# Patient Record
Sex: Female | Born: 1960 | Race: White | Hispanic: No | Smoking: Former smoker
Health system: Southern US, Community
[De-identification: ages and names within clinical notes are randomized; demographics above are authoritative.]

## PROBLEM LIST (undated history)

## (undated) DIAGNOSIS — IMO0001 Reserved for inherently not codable concepts without codable children: Secondary | ICD-10-CM

## (undated) DIAGNOSIS — I10 Essential (primary) hypertension: Secondary | ICD-10-CM

## (undated) DIAGNOSIS — I779 Disorder of arteries and arterioles, unspecified: Secondary | ICD-10-CM

## (undated) DIAGNOSIS — M889 Osteitis deformans of unspecified bone: Secondary | ICD-10-CM

## (undated) DIAGNOSIS — M948X9 Other specified disorders of cartilage, unspecified sites: Secondary | ICD-10-CM

## (undated) DIAGNOSIS — Z720 Tobacco use: Secondary | ICD-10-CM

## (undated) DIAGNOSIS — E785 Hyperlipidemia, unspecified: Secondary | ICD-10-CM

## (undated) DIAGNOSIS — I219 Acute myocardial infarction, unspecified: Secondary | ICD-10-CM

## (undated) DIAGNOSIS — N390 Urinary tract infection, site not specified: Secondary | ICD-10-CM

## (undated) DIAGNOSIS — I739 Peripheral vascular disease, unspecified: Secondary | ICD-10-CM

## (undated) DIAGNOSIS — E1165 Type 2 diabetes mellitus with hyperglycemia: Principal | ICD-10-CM

## (undated) DIAGNOSIS — L409 Psoriasis, unspecified: Secondary | ICD-10-CM

## (undated) DIAGNOSIS — I2510 Atherosclerotic heart disease of native coronary artery without angina pectoris: Secondary | ICD-10-CM

## (undated) DIAGNOSIS — J40 Bronchitis, not specified as acute or chronic: Secondary | ICD-10-CM

## (undated) HISTORY — DX: Acute myocardial infarction, unspecified: I21.9

## (undated) HISTORY — DX: Atherosclerotic heart disease of native coronary artery without angina pectoris: I25.10

## (undated) HISTORY — DX: Other specified disorders of cartilage, unspecified sites: M94.8X9

## (undated) HISTORY — DX: Reserved for inherently not codable concepts without codable children: IMO0001

## (undated) HISTORY — DX: Type 2 diabetes mellitus with hyperglycemia: E11.65

## (undated) HISTORY — DX: Bronchitis, not specified as acute or chronic: J40

## (undated) HISTORY — DX: Peripheral vascular disease, unspecified: I73.9

## (undated) HISTORY — DX: Essential (primary) hypertension: I10

## (undated) HISTORY — DX: Urinary tract infection, site not specified: N39.0

## (undated) HISTORY — DX: Tobacco use: Z72.0

## (undated) HISTORY — DX: Disorder of arteries and arterioles, unspecified: I77.9

---

## 1999-05-04 ENCOUNTER — Other Ambulatory Visit: Admission: RE | Admit: 1999-05-04 | Discharge: 1999-05-04 | Payer: Self-pay | Admitting: Obstetrics and Gynecology

## 1999-06-01 HISTORY — PX: ABDOMINAL HYSTERECTOMY: SHX81

## 1999-07-29 ENCOUNTER — Encounter (INDEPENDENT_AMBULATORY_CARE_PROVIDER_SITE_OTHER): Payer: Self-pay

## 1999-07-30 ENCOUNTER — Inpatient Hospital Stay (HOSPITAL_COMMUNITY): Admission: AD | Admit: 1999-07-30 | Discharge: 1999-07-31 | Payer: Self-pay | Admitting: Obstetrics and Gynecology

## 2006-06-03 ENCOUNTER — Ambulatory Visit: Payer: Self-pay | Admitting: Internal Medicine

## 2006-06-03 LAB — CONVERTED CEMR LAB
ALT: 18 units/L (ref 0–40)
AST: 17 units/L (ref 0–37)
BUN: 10 mg/dL (ref 6–23)
Basophils Absolute: 0.1 10*3/uL (ref 0.0–0.1)
Basophils Relative: 0.8 % (ref 0.0–1.0)
CO2: 30 meq/L (ref 19–32)
Calcium: 8.8 mg/dL (ref 8.4–10.5)
Chol/HDL Ratio, serum: 6.9
Cholesterol: 189 mg/dL (ref 0–200)
Creatinine, Ser: 0.7 mg/dL (ref 0.4–1.2)
Eosinophil percent: 3 % (ref 0.0–5.0)
GFR calc non Af Amer: 96 mL/min
Hemoglobin, Urine: NEGATIVE
Hemoglobin: 15 g/dL (ref 12.0–15.0)
Ketones, ur: NEGATIVE mg/dL
LDL Cholesterol: 132 mg/dL — ABNORMAL HIGH (ref 0–99)
Monocytes Absolute: 1 10*3/uL — ABNORMAL HIGH (ref 0.2–0.7)
Neutro Abs: 5.6 10*3/uL (ref 1.4–7.7)
Nitrite: NEGATIVE
RBC: 5.04 M/uL (ref 3.87–5.11)
Sodium: 140 meq/L (ref 135–145)
Total Protein: 6.9 g/dL (ref 6.0–8.3)
VLDL: 29 mg/dL (ref 0–40)
WBC: 9.9 10*3/uL (ref 4.5–10.5)
pH: 5.5 (ref 5.0–8.0)

## 2006-06-07 ENCOUNTER — Ambulatory Visit: Payer: Self-pay | Admitting: Internal Medicine

## 2006-07-08 ENCOUNTER — Ambulatory Visit: Payer: Self-pay | Admitting: *Deleted

## 2006-07-18 ENCOUNTER — Encounter: Payer: Self-pay | Admitting: Internal Medicine

## 2006-07-18 ENCOUNTER — Ambulatory Visit (HOSPITAL_COMMUNITY): Admission: RE | Admit: 2006-07-18 | Discharge: 2006-07-18 | Payer: Self-pay | Admitting: Internal Medicine

## 2006-08-03 ENCOUNTER — Ambulatory Visit: Payer: Self-pay | Admitting: Internal Medicine

## 2006-08-04 ENCOUNTER — Ambulatory Visit: Payer: Self-pay | Admitting: Internal Medicine

## 2007-06-27 DIAGNOSIS — Z9189 Other specified personal risk factors, not elsewhere classified: Secondary | ICD-10-CM | POA: Insufficient documentation

## 2007-06-27 DIAGNOSIS — M25519 Pain in unspecified shoulder: Secondary | ICD-10-CM | POA: Insufficient documentation

## 2007-06-27 DIAGNOSIS — M25569 Pain in unspecified knee: Secondary | ICD-10-CM | POA: Insufficient documentation

## 2007-06-27 DIAGNOSIS — L408 Other psoriasis: Secondary | ICD-10-CM | POA: Insufficient documentation

## 2009-02-13 ENCOUNTER — Ambulatory Visit: Payer: Self-pay | Admitting: Internal Medicine

## 2009-02-13 DIAGNOSIS — E782 Mixed hyperlipidemia: Secondary | ICD-10-CM | POA: Insufficient documentation

## 2009-02-13 DIAGNOSIS — M948X9 Other specified disorders of cartilage, unspecified sites: Secondary | ICD-10-CM | POA: Insufficient documentation

## 2009-02-13 DIAGNOSIS — E119 Type 2 diabetes mellitus without complications: Secondary | ICD-10-CM | POA: Insufficient documentation

## 2009-02-18 ENCOUNTER — Encounter: Payer: Self-pay | Admitting: Internal Medicine

## 2009-04-14 ENCOUNTER — Encounter: Payer: Self-pay | Admitting: Internal Medicine

## 2010-06-21 ENCOUNTER — Encounter: Payer: Self-pay | Admitting: Internal Medicine

## 2010-06-28 LAB — CONVERTED CEMR LAB
BUN: 8 mg/dL (ref 6–23)
CO2: 31 meq/L (ref 19–32)
Calcium: 9.2 mg/dL (ref 8.4–10.5)
Creatinine, Ser: 0.7 mg/dL (ref 0.4–1.2)
GFR calc non Af Amer: 94.35 mL/min (ref >60)
Glucose, Bld: 269 mg/dL — ABNORMAL HIGH (ref 70–99)
Hgb A1c MFr Bld: 11.1 % — ABNORMAL HIGH (ref 4.6–6.5)
Potassium: 4 meq/L (ref 3.5–5.1)
Total CHOL/HDL Ratio: 8

## 2010-10-16 NOTE — Assessment & Plan Note (Signed)
[facility]                           PRIMARY CARE OFFICE NOTE   Monica Michael, Monica Michael                       MRN:          [id number]  DATE:06/07/2006                            DOB:          [birth year]    Monica Michael is a 50 year old Caucasian woman who presents to establish  ongoing continuity care.   CHIEF COMPLAINT:  1. Left knee pain, which has been intermittently bothering her for      some time but now is becoming somewhat worse.  It does not limit      her activities.  She has had no swelling, erythema or acute pain.  2. Right shoulder pain.  The patient has some right shoulder pain with      decreased range of motion.  The patient denies any previous injury.  3. Derm.  The patient with history of possible psoriasis which waxes      and wanes and is currently quiescent.   PAST MEDICAL HISTORY:   SURGICAL:  Hysterectomy in 2001 for uterine fibroids.   MEDICAL:  The patient had measles and mumps as a child.  She had chicken  pox as an adult.  She has pustular psoriasis.  GYN the patient is a gravida 1, para 1.   CURRENT MEDICATIONS:  No prescription drugs.   HABITS:  Tobacco at 1 pack per day with a 30 pack year smoking history.  Alcohol use is rare.  No recreational drug use.   FAMILY HISTORY:  Positive for arthritis in her grandparents, positive  for a history of hyperlipidemia throughout the family.  History of heart  disease through the family including an MI that are fatal for the men in  their 4s, women in their 50s.  Positive for stroke in her grandparents.  Positive for hypertension in all generations.  Positive for diabetes in  her father who also has end-stage renal disease and dialysis.  Positive  for diabetes in father, grandparents and other blood relatives.  Mother  had Hodgkin's disease and an uncle with leukemia.   SOCIAL HISTORY:  The patient has had 3 years of college.  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  She was  married for 14 years.  Divorced and single for almost 10 years, has been  remarried for 2 years.  The patient has an 63 year old son who currently  lives at home.  She reports her marriage is in good health.   REVIEW OF SYSTEMS:  The patient has had no constitutional problems.  She  has had an eye exam in the last 12 months.  No ENT, cardiovascular,  respiratory, GI complaints.  The patient has occasional stress  incontinence.  MSK as above.  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  No neurologic or psychiatric issues.   EXAMINATION:  Temperature was 98.  Blood pressure 97/68, pulse 68,  weight 169.  GENERAL:  Appearance is a well-nourished, well-developed woman in no  acute distress.  HEENT:  Normocephalic, atraumatic.  EAC's and TMs were unremarkable.  Oropharynx with native dentition in good repair.  No buccal or palate  lesions were noted.  Posterior pharynx was clear.  Conjunctivae was  clear.  PERRLA, EOMI, funduscopic exam with a hand-held instrument  revealed normal disc margins.  I did not appreciate any vascular  abnormalities.  NECK:  Was supple without thyromegaly or nodes.  No adenopathy was noted  in the cervical supraclavicular regions.  CHEST:  No CVA tenderness.  LUNGS:  Were clear to auscultation and percussion.  BREAST:  Exam deferred to Dr. Arelia Sneddon for GYN.  ABDOMEN:  Soft, no guarding or rebound.  No organosplenomegaly was  noted.  PELVIC & RECTAL:  Exams deferred to Dr. Arelia Sneddon.  EXTREMITIES:  No clubbing, cyanosis or edema.  The patient seems to have  no erythema or effusion about the left knee.  She does seem to have well  preserved range of motion.  The patient seems to have well preserved  range of motion of the right shoulder, although some discomfort in the  extremes.  NEURO:  The patient is awake, alert, oriented to person, place, time and  contacts.  Cranial nerves II through XII grossly  intact.  DTRs are 2+  and symmetrical in the biceps, radial and patellar tendon.  The patient  was neurologically otherwise intact.  DERM:  The patient has palmar erythema.  Did not appreciate any other  significant pustular lesions.  Perineal and vulva vaginal exam was not performed.   DATA BASE:  A 12-lead electrocardiogram revealed a normal sinus rhythm  without any acute or chronic injury.   Laboratory hemoglobin 15 grams, hematocrit was 44.7%, white count was 9,  900 with a normal differential.  Chemistries revealed normal  electrolytes.  Glucose was 142.  Kidney function normal with a  creatinine of 0.7.  Liver functions were normal.  Thyroid function  normal with a TSH of 1.43.  Urinalysis was negative.  Cholesterol is  189, triglycerides147, HDL was 27.5, LDL was 132.   ASSESSMENT AND PLAN:  1. Diabetes.  The patient with a fasting blood sugar of 142 indicates      she is most likely diabetic.  She does have a positive family      history for diabetes.  The patient has no prior diagnosis.  I have      no prior laboratories for comparison.  Plan:  Discussed with the      patient in detail.  I recommended lifestyle management with the      elimination of sugar from her diet and limit carbohydrates.  She is      provided with care notes printout on low carb counting diet for      diabetics.  2. Lipids.  Given the patient's hyperglycemia, probable diabetes, the      LDL goal for this patient would be 100 or less to reduce cardiac      risk.  Plan:  The patient is to follow a low fat diet.  I have      encouraged her to exercise on a regular basis for both diabetes and      for the management of hyperlipidemia.  3. The patient is to return in 3 months for repeat lipid panel and if      she is not at goal of 100 or less we need to consider medication.      The patient will also have a hemoglobin A1C to better quantitate      her diabetes. 4.  Derm.  The patient is currently stable  and follows with Dr. Venancio Poisson on a regular basis.  5. Ortho.  Formal examination was not done of her joints as the      patient does seem to be moving well.  I would like to see her back      for a consolidation visit in 3 to 4 months, at which time we can      reassess her joint discomfort.  I would have her take a      nonsteroidal anti-inflammatory drug at this time for relief of      discomfort.  If her symptoms become worse, would be happy to see      her back in the short term for reevaluation.  6. Health maintenance.  The patient is current and up to date with her      gynecologist for pelvic and Pap smear.  Her last exam was in 2001      and last breast exam was at that time as well.  The patient states      she will be scheduling an appointment with Dr. Arelia Sneddon for a      followup.   SUMMARY:  Presentation with the metabolic derangements as noted above.  Also with ongoing knee and shoulder pain which may need reassessment in  the near term if her symptoms continue to bother her.     Rosalyn Gess Norins, MD  Electronically Signed    MEN/MedQ  DD: 06/07/2006  DT: 06/08/2006  Job #: [id number]   cc:   Raynelle Highland, M.D.  Venancio Poisson, MD

## 2010-10-16 NOTE — Op Note (Signed)
[facility]  Patient:    Monica Michael, Monica Michael                      MRN: [id number] Proc. Date: 07/29/99 Adm. Date:  [id number] Attending:  Frederich Balding                           Operative Report  PREOPERATIVE DIAGNOSIS:  Uterine fibroids.  POSTOPERATIVE DIAGNOSIS:  Uterine fibroids.  Severe pelvic endometriosis.  OPERATION:  Diagnostic laparoscopy, subsequent exploratory laparotomy, total abdominal hysterectomy.  SURGEON:  Juluis Mire, M.D.  ASSISTANT:  Trevor Iha, M.D.  ANESTHESIA:  General endotracheal.  ESTIMATED BLOOD LOSS:  200 to 300 cc.  PACKS AND DRAINS:  None.  INTRAOPERATIVE BLOOD REPLACED:  None.  COMPLICATIONS:  None.  INDICATIONS:  Dictated in the history and physical.  DESCRIPTION OF PROCEDURE:  The patient was taken to the operating room and placed in the supine position.  After satisfactory level of general endotracheal anesthesia was obtained, the patient was placed in dorsal lithotomy position using the Allen stirrups.  The abdomen, perineum and vagina were prepped out with Betadine.  Examination under anesthesia revealed the uterus to be 10 to 12 weeks in size.  Adnexa unremarkable.  Speculum was placed in the vaginal vault.  The cervix was grasped with a single-toothed tenaculum.  A Hulka tenaculum was put in place. The single-toothed tenaculum and speculum were then removed. The bladder was emptied by in and out catheterization.  The patient was draped out for laparoscopy. A subumbilical incision made with a knife.  The Veress needle was introduced and the abdominal cavity.  The abdomen insufflated with approximately 2L carbon dioxide.  The operating laparoscope was introduced.  There was no evidence of injury to adjacent organs.  A 5 mm trocar was put in place under direct visualization.  The lateral gutters were clear including appendix.  The upper abdomen including liver and tip of the  gallbladder were unremarkable.  The uterus was enlarged with a large fundal fibroid.  The cul-de-sac was completely obstructed by an aggressive adhesive process between the colon and the back of the uterus.  The ovaries were involved in this process.  It was a thick adhesive process that could not easily be taken down through the laparoscope and precluded a vaginal approach to the surgery.  The decision was then to proceed with an abdominal hysterectomy.  The abdomen was deflated with carbon dioxide.  All trocars were removed. The subumbilical incision was closed with interrupted subcuticulars of 4-0 Vicryl.  The patient was repositioned in the supine position.  Next, a low transverse skin incision was made with a knife and carried through the subcutaneous tissues.  The fascia was entered sharply and incision in the fascia extended laterally.  The fascia was taken off the muscle superiorly and inferiorly.  The  rectus muscles were separated in the midline.  The peritoneum was entered sharply and incision in the peritoneum extended superiorly and inferiorly. OSullivan-OConnor retractor was put in place and bowel contents were packed superiorly out of the pelvic cavity.  We first turned to the posterior aspect of the uterus.  Both ovaries were dissected free from the adhesive process.  Next, we did free up some of the cul-de-sac area.  Next, both round ligaments were clamped, cut and suture ligated with 0 Vicryl.  Utero-ovarian pedicles were isolated, clamped, cut and doubly ligated first with  a free tie of 0 Vicryl, then a suture ligature of 0 Vicryl.  The bladder flap was then developed.  The uterine vessels skeletonized, clamped, cut and suture ligated with 0 Vicryl.  We then morselated the fundus from the cervical stump so we could better see the cul-de-sac area. The stump was grasped with a single-toothed tenaculum.  The colon was then carefully dissected from its  attachment to the posterior cervical stump.  At this point in time, we developed a space between the rectum and the vagina.  The uterosacral ligaments were thick but easily identifiable.  We used a clamp, cut and tie technique with suture ligatures of 0 Vicryl.  The parametrium was serially separated from the sides of the cervical stump.  Both uterosacral ligaments were clamped, cut and suture ligated with 0 Vicryl.  Vaginal angles were clamped and  cut.  The intervening vaginal mucosa was excised and the stump was passed off the operative field.  Vaginal angles secured with suture ligature of 0 Vicryl.  The  intervening vaginal mucosa closed with interrupted simple figure-of-eights of 0  Vicryl.  We thoroughly irrigated the pelvis.  Areas of oozing brought under control using the bipolar.  The ovaries were suspended to the round ligament.  Areas of  endometriosis on the ovaries were cauterized.  No endometriomas were noted. Atthe end of the procedure, we had excellent hemostasis and good suspension of the ovaries.  The OSullivan-OConnor retractor and packs were removed.  Muscles were  reapproximated with a running suture of 2-0 Vicryl.  Fascia closed with a running suture of 0 PDS.  Skin was closed with staples and Steri-Strips.  Sponge and instrument counts were reported correct by the circulating nurse x 2.  The patient tolerated the procedure well and was returned to the recovery room in good condition. DD:  07/30/99 TD:  07/30/99 Job: [id number]

## 2010-10-16 NOTE — Assessment & Plan Note (Signed)
[facility]                           PRIMARY CARE OFFICE NOTE   Monica Michael, Monica Michael                       MRN:          [id number]  DATE:08/03/2006                            DOB:          [birth year]    Monica Michael was seen as a new patient on 06/07/06, please see that  dictation. As part of a routine evaluation of a new patient, who is also  a smoker, a chest x-ray was obtained which revealed the patient to have  an asymmetric opacity in the left lung apex relative to the right. CT  scan was recommended and subsequently obtained.   CT performed 07/08/06 revealed tiny nodules within the left upper lobe  that are non-specific and could be a sequela of inflammation or  infection. Because of her smoking history, a follow up CT was  recommended in 3 months. There was abnormal sclerosis involving the mid-  thoracic spine noticed on the CT scan, read as suspicious for sclerotic  metastasis. 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 There was  an abnormal appearance at the manubrium and bilateral first ribs and  clavicular head that also favors fibrous dysplasia versus post traumatic  deformity.   Of note the patient has been well. She has had no weight loss, night  sweats, lymphadenopathy, weakness, or other evidence of any significant  underlying process, such as an occult malignancy.   The patient underwent a whole body bone scan on 07/18/06 read out by Dr.  Colonel Bald. It was felt that the appearance of these lesions at the  manubrium adjacent first ribs clavicular head and T9 vertebral body were  suggestive of fibrous dysplasia such as Monica Michael or possibly  post traumatic. It was not felt that these looked like metastatic  lesions. Dr. Sherrie Mustache in his discussion was concerned that there could be  an underlying malignant process and did raise the possibility of  pursuing this further with PET CT to look for any metabolically active  lesions.   The patient was in the office today to discuss these results. A long  discussion ensued including a review of her risks for cancer, as well as  a review of systems as to whether she has had any symptoms that could  suggest malignancy. 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 The patient was  provided with information from up-to-date for her review including a  description of Monica Michael, as well as the available treatments.  Before deciding upon any treatments, such as a bisphosphonate, she will  proceed with a DEXA scan. In addition, the patient is adamantly  encouraged to schedule a mammogram. This is part of the evaluation to  make sure that there is not an underlying malignancy present.   The patient will notify me of her mammogram results. She will be  scheduled for a DEXA scan and therapy will be recommended based on these  findings.   Both the patient and her significant  other were satisfied with this  discussion and understand fully the import of the study findings and  agreed to the plan as above.     Monica Gess Norins, MD  Electronically Signed    MEN/MedQ  DD: 08/04/2006  DT: 08/04/2006  Job #: [id number]   cc:   Gilford Silvius. Fischer, M.D.

## 2010-10-16 NOTE — Discharge Summary (Signed)
[facility]  Patient:    Monica Michael, Monica Michael                      MRN: [id number] Adm. Date:  [id number] Disc. Date: [id number] Attending:  Frederich Balding                           Discharge Summary  ADMISSION DIAGNOSIS:          Uterine fibroids.  DISCHARGE DIAGNOSES:          1. Uterine fibroids.                               2. Significant pelvic endometriosis.  PROCEDURE:                    Laparoscopy with subsequent total abdominal hysterectomy.  HISTORY OF PRESENT ILLNESS:   For the complete history and physical, please see the dictated note.  HOSPITAL COURSE:              The patient underwent laparoscopy with finding of  extensive pelvic adhesions and a large fundal fibroid.  She subsequently underwent a laparotomy through a low transverse incision and subsequent total abdominal hysterectomy.  Postoperatively, the patient did extremely well.  Pathology is still pending on the surgical specimen.  Her postoperative hemoglobin was 11, platelet count was 375,000.  She had an uncomplicated postoperative course and was discharged home on her second postoperative day.  At that time she was tolerating a regular diet and ambulating without difficulty.  Bowel sounds were active, although, she was not passing flatus.  She was voiding without difficulty.  She had no active bleeding.  A low transverse incision was intact and staples had been removed.  COMPLICATIONS:                None were encountered during her stay in the hospital. The patient was discharged home in stable condition.  DISPOSITION:                  Routine postoperative instructions and orders were given.  She is to avoid heavy lifting, vaginal entrance, or driving of a car. he is discharged home on Tylox as she needs for pain, and iron sulfate supplementation.  She is to call with a fever, increasing nausea and vomiting, abdominal pain, or active vaginal bleeding.   Reevaluation in the office in one week. DD:  07/31/99 TD:  08/01/99 Job: [id number]

## 2011-06-01 DIAGNOSIS — I2510 Atherosclerotic heart disease of native coronary artery without angina pectoris: Secondary | ICD-10-CM

## 2011-06-01 HISTORY — DX: Atherosclerotic heart disease of native coronary artery without angina pectoris: I25.10

## 2011-07-02 ENCOUNTER — Other Ambulatory Visit: Payer: Self-pay

## 2011-07-02 ENCOUNTER — Inpatient Hospital Stay (HOSPITAL_COMMUNITY)
Admission: EM | Admit: 2011-07-02 | Discharge: 2011-07-05 | DRG: 247 | Disposition: A | Discharge status: Home / Self Care | Payer: 59 | Attending: Cardiology | Admitting: Cardiology

## 2011-07-02 ENCOUNTER — Encounter (HOSPITAL_COMMUNITY): Payer: Self-pay | Admitting: Emergency Medicine

## 2011-07-02 ENCOUNTER — Emergency Department (HOSPITAL_COMMUNITY): Payer: 59

## 2011-07-02 DIAGNOSIS — I2510 Atherosclerotic heart disease of native coronary artery without angina pectoris: Secondary | ICD-10-CM | POA: Diagnosis present

## 2011-07-02 DIAGNOSIS — R739 Hyperglycemia, unspecified: Secondary | ICD-10-CM

## 2011-07-02 DIAGNOSIS — I214 Non-ST elevation (NSTEMI) myocardial infarction: Secondary | ICD-10-CM

## 2011-07-02 DIAGNOSIS — IMO0001 Reserved for inherently not codable concepts without codable children: Secondary | ICD-10-CM | POA: Diagnosis present

## 2011-07-02 DIAGNOSIS — E785 Hyperlipidemia, unspecified: Secondary | ICD-10-CM

## 2011-07-02 DIAGNOSIS — F17200 Nicotine dependence, unspecified, uncomplicated: Secondary | ICD-10-CM | POA: Diagnosis present

## 2011-07-02 DIAGNOSIS — R079 Chest pain, unspecified: Secondary | ICD-10-CM

## 2011-07-02 DIAGNOSIS — E119 Type 2 diabetes mellitus without complications: Secondary | ICD-10-CM

## 2011-07-02 DIAGNOSIS — Z720 Tobacco use: Secondary | ICD-10-CM

## 2011-07-02 DIAGNOSIS — M889 Osteitis deformans of unspecified bone: Secondary | ICD-10-CM | POA: Insufficient documentation

## 2011-07-02 HISTORY — DX: Hyperlipidemia, unspecified: E78.5

## 2011-07-02 HISTORY — DX: Psoriasis, unspecified: L40.9

## 2011-07-02 HISTORY — PX: CORONARY ANGIOPLASTY WITH STENT PLACEMENT: SHX49

## 2011-07-02 HISTORY — DX: Osteitis deformans of unspecified bone: M88.9

## 2011-07-02 LAB — DIFFERENTIAL
Eosinophils Absolute: 0.1 10*3/uL (ref 0.0–0.7)
Eosinophils Relative: 1 % (ref 0–5)
Lymphs Abs: 2.3 10*3/uL (ref 0.7–4.0)
Monocytes Absolute: 0.9 10*3/uL (ref 0.1–1.0)

## 2011-07-02 LAB — CBC
HCT: 46.5 % — ABNORMAL HIGH (ref 36.0–46.0)
MCH: 29.2 pg (ref 26.0–34.0)
MCV: 81.4 fL (ref 78.0–100.0)
Platelets: 242 10*3/uL (ref 150–400)
RBC: 5.71 MIL/uL — ABNORMAL HIGH (ref 3.87–5.11)
RDW: 13.7 % (ref 11.5–15.5)

## 2011-07-02 LAB — BASIC METABOLIC PANEL
Calcium: 10.4 mg/dL (ref 8.4–10.5)
Creatinine, Ser: 0.56 mg/dL (ref 0.50–1.10)
GFR calc non Af Amer: >90 mL/min (ref >90)
Glucose, Bld: 255 mg/dL — ABNORMAL HIGH (ref 70–99)
Sodium: 136 mEq/L (ref 135–145)

## 2011-07-02 LAB — CARDIAC PANEL(CRET KIN+CKTOT+MB+TROPI): CK, MB: 8.5 ng/mL (ref 0.3–4.0)

## 2011-07-02 MED ORDER — MORPHINE SULFATE 4 MG/ML IJ SOLN
4.0000 mg | Freq: Once | INTRAMUSCULAR | Status: AC
  Administered 2011-07-02: 4 mg via INTRAVENOUS
  Filled 2011-07-02: qty 1

## 2011-07-02 MED ORDER — ONDANSETRON HCL 4 MG/2ML IJ SOLN: 4.0000 mg | Freq: Four times a day (QID) | INTRAMUSCULAR | Status: DC | PRN

## 2011-07-02 MED ORDER — INSULIN ASPART 100 UNIT/ML ~~LOC~~ SOLN: 0.0000 [IU] | Freq: Three times a day (TID) | SUBCUTANEOUS | Status: DC

## 2011-07-02 MED ORDER — HEPARIN SODIUM (PORCINE) 5000 UNIT/ML IJ SOLN
INTRAMUSCULAR | Status: AC
  Filled 2011-07-02: qty 1

## 2011-07-02 MED ORDER — METOPROLOL TARTRATE 12.5 MG HALF TABLET
12.5000 mg | ORAL_TABLET | Freq: Two times a day (BID) | ORAL | Status: DC
  Administered 2011-07-03 (×3): 12.5 mg via ORAL
  Filled 2011-07-02 (×5): qty 1

## 2011-07-02 MED ORDER — ALPRAZOLAM 0.25 MG PO TABS: 0.2500 mg | ORAL_TABLET | Freq: Two times a day (BID) | ORAL | Status: DC | PRN

## 2011-07-02 MED ORDER — SODIUM CHLORIDE 0.9 % IV SOLN: 250.0000 mL | INTRAVENOUS | Status: DC | PRN

## 2011-07-02 MED ORDER — HEPARIN BOLUS VIA INFUSION
4000.0000 [IU] | Freq: Once | INTRAVENOUS | Status: AC
  Administered 2011-07-02: 4000 [IU] via INTRAVENOUS

## 2011-07-02 MED ORDER — SODIUM CHLORIDE 0.9 % IJ SOLN: 3.0000 mL | INTRAMUSCULAR | Status: DC | PRN

## 2011-07-02 MED ORDER — HEPARIN SOD (PORCINE) IN D5W 100 UNIT/ML IV SOLN
1300.0000 [IU]/h | INTRAVENOUS | Status: DC
  Administered 2011-07-02: 1300 [IU]/h via INTRAVENOUS
  Filled 2011-07-02 (×2): qty 250

## 2011-07-02 MED ORDER — ROSUVASTATIN CALCIUM 40 MG PO TABS
40.0000 mg | ORAL_TABLET | Freq: Every day | ORAL | Status: DC
  Administered 2011-07-03 – 2011-07-05 (×3): 40 mg via ORAL
  Filled 2011-07-02 (×3): qty 1

## 2011-07-02 MED ORDER — SODIUM CHLORIDE 0.9 % IJ SOLN
3.0000 mL | Freq: Two times a day (BID) | INTRAMUSCULAR | Status: DC
  Administered 2011-07-03 (×2): 3 mL via INTRAVENOUS

## 2011-07-02 MED ORDER — INSULIN ASPART 100 UNIT/ML ~~LOC~~ SOLN
0.0000 [IU] | Freq: Three times a day (TID) | SUBCUTANEOUS | Status: DC
  Administered 2011-07-03: 2 [IU] via SUBCUTANEOUS
  Administered 2011-07-03: 5 [IU] via SUBCUTANEOUS
  Administered 2011-07-04 (×2): 2 [IU] via SUBCUTANEOUS
  Administered 2011-07-05: 3 [IU] via SUBCUTANEOUS
  Filled 2011-07-02 (×2): qty 3

## 2011-07-02 MED ORDER — ASPIRIN EC 81 MG PO TBEC
81.0000 mg | DELAYED_RELEASE_TABLET | Freq: Every day | ORAL | Status: DC
  Administered 2011-07-03 – 2011-07-05 (×3): 81 mg via ORAL
  Filled 2011-07-02 (×3): qty 1

## 2011-07-02 MED ORDER — NITROGLYCERIN 0.4 MG SL SUBL
0.4000 mg | SUBLINGUAL_TABLET | SUBLINGUAL | Status: DC | PRN
  Administered 2011-07-02: 0.4 mg via SUBLINGUAL

## 2011-07-02 MED ORDER — ACETAMINOPHEN 325 MG PO TABS: 650.0000 mg | ORAL_TABLET | ORAL | Status: DC | PRN

## 2011-07-02 MED ORDER — FAMOTIDINE 20 MG PO TABS
20.0000 mg | ORAL_TABLET | Freq: Once | ORAL | Status: AC
  Administered 2011-07-02: 20 mg via ORAL
  Filled 2011-07-02: qty 1

## 2011-07-02 MED ORDER — ASPIRIN 81 MG PO CHEW: 324.0000 mg | CHEWABLE_TABLET | ORAL | Status: DC

## 2011-07-02 MED ORDER — HEPARIN BOLUS VIA INFUSION: 5000.0000 [IU] | Freq: Once | INTRAVENOUS | Status: DC

## 2011-07-02 MED ORDER — ASPIRIN 81 MG PO CHEW
324.0000 mg | CHEWABLE_TABLET | Freq: Once | ORAL | Status: AC
  Administered 2011-07-02: 324 mg via ORAL
  Filled 2011-07-02: qty 4

## 2011-07-02 MED ORDER — NITROGLYCERIN 0.4 MG SL SUBL
0.4000 mg | SUBLINGUAL_TABLET | Freq: Once | SUBLINGUAL | Status: AC
  Administered 2011-07-02: 0.4 mg via SUBLINGUAL
  Filled 2011-07-02: qty 75

## 2011-07-02 NOTE — H&P (Addendum)
Patient ID: Monica Michael MRN: [id number] DOB/AGE: [birth year] 51 y.o. Admit date: 07/02/2011  Primary Care Physician:Michael Norins, MD, MD Primary Cardiologist  Narda Rutherford  HPI:   I was called to come to the emergency room to see the patient immediately with ongoing chest pain. The patient is followed by Dr. Debby Bud . She has not seen him for at least 2 years. Also he had written prescriptions for her for her metformin and simvastatin. She's not taking them. Over the past day the patient has had chest pain that has been intermittent. She had several episodes during the night. She continued to have waves of this chest discomfort and eventually came to the emergency room. In the emergency room she received morphine and nitroglycerin and she felt better. Her troponin is positive. At this time she is pain-free.  Review of systems complete and found to be negative unless listed above  Past Medical History  Diagnosis Date  . Diabetes mellitus     No family history on file.  History   Social History  . Marital Status: Widowed    Spouse Name: N/A    Number of Children: N/A  . Years of Education: N/A   Occupational History  . Not on file.   Social History Main Topics  . Smoking status: Current Everyday Smoker  . Smokeless tobacco: Not on file  . Alcohol Use: No  . Drug Use: No  . Sexually Active:    Other Topics Concern  . Not on file   Social History Narrative  . No narrative on file    Past Surgical History  Procedure Date  . Abdominal hysterectomy        Physical Exam: Blood pressure 113/97, pulse 85, temperature 98 F (36.7 C), temperature source Oral, resp. rate 15, height 5\' 3"  (1.6 m), weight 190 lb (86.183 kg), SpO2 98.00%.  Patient is quite stable at this time. She has no chest pain. Head is atraumatic. There is no jugulovenous distention. Lungs are clear. Respiratory effort is nonlabored. Cardiac exam reveals S1 and S2. There are no clicks or significant murmurs. The  abdomen is soft. There is no peripheral edema. She does have some skin lesions of psoriasis. There no musculoskeletal deformities.   Labs:   Lab Results  Component Value Date   WBC 8.1 07/02/2011   HGB 16.7* 07/02/2011   HCT 46.5* 07/02/2011   MCV 81.4 07/02/2011   PLT 242 07/02/2011    Lab 07/02/11 1724  NA 136  K 3.6  CL 97  CO2 28  BUN 10  CREATININE 0.56  CALCIUM 10.4  PROT --  BILITOT --  ALKPHOS --  ALT --  AST --  GLUCOSE 255*   Lab Results  Component Value Date   CKTOTAL 117 07/02/2011   CKMB 8.5* 07/02/2011   TROPONINI 1.44* 07/02/2011    Lab Results  Component Value Date   CHOL 189 02/13/2009   CHOL 189 06/03/2006   Lab Results  Component Value Date   HDL 22.40* 02/13/2009   HDL 27.5* 06/03/2006   Lab Results  Component Value Date   LDLCALC 129* 02/13/2009   LDLCALC 132* 06/03/2006   Lab Results  Component Value Date   TRIG 187.0* 02/13/2009   Lab Results  Component Value Date   CHOLHDL 8 02/13/2009       Radiology: Dg Chest 2 View  07/02/2011  *RADIOLOGY REPORT*  Clinical Data: Chest pain.  CHEST - 2 VIEW  Comparison: Plain films  of the chest 06/07/2006 and CT chest 07/08/2006.  Findings: Chronic peribronchial thickening is unchanged.  There is no focal airspace disease or effusion.  Heart size is normal.  No focal bony abnormality.  IMPRESSION: No acute finding.  Stable compared to prior exam.  Original Report Authenticated By: Bernadene Bell. D'ALESSIO, M.D.   EKG:  EKG is done emergency room. I personally reviewed it. There is normal sinus rhythm. There are mild nonspecific ST-T wave changes with some ST depression.  ASSESSMENT AND PLAN:    Principal Problem:   *Non-STEMI (non-ST elevated myocardial infarction) The patient has symptoms and labs consistent with non-STEMI. She has received aspirin. She's getting heparin at this time. She'll be treated with a beta blocker if her heart rate and blood pressure are stable. Statin will be added. If she has any significant  return of chest pain urgent cardiac catheterization will be done.   DIAB W/O MENTION COMP TYPE II/UNS TYPE UNCNTRL  Unfortunately the patient did not follow her physician's orders and start her medicines for her diabetes. These will be started in the hospital.   Hyperlipidemia Patient failed to start her medications. Statin will be started immediately.   Signed: Willa Rough 07/02/2011, 7:07 PM Co-Sign MD

## 2011-07-02 NOTE — Progress Notes (Signed)
ANTICOAGULATION CONSULT NOTE - Initial Consult  Pharmacy Consult for Heparin Indication: chest pain/ACS  No Known Allergies  Patient Measurements: Height: 5\' 3"  (160 cm) Weight: 190 lb (86.183 kg) IBW/kg (Calculated) : 52.4  Heparin Dosing Weight: 86 kg  Vital Signs: Temp: 98 F (36.7 C) (02/01 1754) Temp src: Oral (02/01 1754) BP: 113/97 mmHg (02/01 1815) Pulse Rate: 85  (02/01 1815)  Labs:  Basename 07/02/11 1724  HGB 16.7*  HCT 46.5*  PLT 242  APTT --  LABPROT --  INR --  HEPARINUNFRC --  CREATININE 0.56  CKTOTAL 117  CKMB 8.5*  TROPONINI 1.44*   Estimated Creatinine Clearance: 86.6 ml/min (by C-G formula based on Cr of 0.56).  Medical History: Past Medical History  Diagnosis Date  . Diabetes mellitus     Medications:   (Not in a hospital admission)  Assessment: 51 yo F admitted with chest pain to start heparin.  Patient denies recent bleeding, surgeries and bleeding disorder.    Goal of Therapy:  Heparin level 0.3-0.7 units/ml   Plan:  1. Heparin 4000 units x 1 then 2. Heparin 1300 units/hr 3. Heparin level 6h s/p ggt starts 4. Daily heparin level and CBC  Lovenia Kim Pharm.D., BCPS Clinical Pharmacist 07/02/2011 7:12 PM Pager: [phone] Phone: [phone]

## 2011-07-02 NOTE — ED Provider Notes (Signed)
History     CSN: [id number]  Arrival date & time 07/02/11  1537   First MD Initiated Contact with Patient 07/02/11 1643      Chief Complaint  Patient presents with  . Chest Pain    (Consider location/radiation/quality/duration/timing/severity/associated sxs/prior treatment) HPI  50yoF h/o non-insulin-dependent diabetes (noncompliant with metformin) presents with chest pain. She states that she first noticed chest pain approximately one week ago. She states at that time she had it at rest for several minutes and subsided spontaneously. She states that last night she had a meal of shrimp and grits, collard greens and shortly thereafter she began having a burning sensation in her chest and her throat. She states that the pain escalated and she felt burning to her bilateral upper extremities. Numbness, tingling, weakness to the same. There is no radiation to her back. No h/o same. No h/o GERD per patient. Also c/o R lower back numbness which has been present x number of months. Denies back pain. No rash.    ED Notes, ED Provider Notes from 07/02/11 0000 to 07/02/11 16:25:46       Rulon Eisenmenger, RN 07/02/2011 16:22      Pt st's she had mid chest pain 1 week ago then subsided, had the pain again last pm with some nausea and diaphoresis. Pt denies any pain at this time. St's had pain earlier today. Pt also c/o numbness in lower back.    Past Medical History  Diagnosis Date  . Diabetes mellitus   . Hyperlipidemia   . Psoriasis   . Paget's disease of bone     Noted on chest CT scan    Past Surgical History  Procedure Date  . Abdominal hysterectomy     No family history on file.  History  Substance Use Topics  . Smoking status: Current Everyday Smoker  . Smokeless tobacco: Not on file  . Alcohol Use: No    OB History    Grav Para Term Preterm Abortions TAB SAB Ect Mult Living                  Review of Systems  All other systems reviewed and are negative.  except as noted  HPI   Allergies  Review of patient's allergies indicates no known allergies.  Home Medications   Current Outpatient Rx  Name Route Sig Dispense Refill  . NAPROXEN SODIUM 220 MG PO TABS Oral Take 220 mg by mouth 2 (two) times daily with a meal.      BP 113/97  Pulse 85  Temp(Src) 98 F (36.7 C) (Oral)  Resp 15  Ht 5\' 3"  (1.6 m)  Wt 190 lb (86.183 kg)  BMI 33.66 kg/m2  SpO2 98%  Physical Exam  Nursing note and vitals reviewed. Constitutional: She is oriented to person, place, and time. She appears well-developed.  HENT:  Head: Atraumatic.  Mouth/Throat: Oropharynx is clear and moist.  Eyes: Conjunctivae and EOM are normal. Pupils are equal, round, and reactive to light.  Neck: Normal range of motion. Neck supple.  Cardiovascular: Normal rate, regular rhythm, normal heart sounds and intact distal pulses.   Pulmonary/Chest: Effort normal and breath sounds normal. No respiratory distress. She has no wheezes. She has no rales.  Abdominal: Soft. She exhibits no distension. There is no tenderness. There is no rebound and no guarding.  Musculoskeletal: Normal range of motion.  Neurological: She is alert and oriented to person, place, and time.  Skin: Skin is warm and dry.  No rash noted.  Psychiatric: She has a normal mood and affect.     Date: 07/02/2011  Rate: 81  Rhythm: normal sinus rhythm  QRS Axis: normal  Intervals: normal  ST/T Wave abnormalities: ST depressions laterally  Conduction Disutrbances:none  Narrative Interpretation:   Old EKG Reviewed: none given for comparison   ED Course  Procedures (including critical care time)  Labs Reviewed  CBC - Abnormal; Notable for the following:    RBC 5.71 (*)    Hemoglobin 16.7 (*)    HCT 46.5 (*)    All other components within normal limits  BASIC METABOLIC PANEL - Abnormal; Notable for the following:    Glucose, Bld 255 (*)    All other components within normal limits  CARDIAC PANEL(CRET KIN+CKTOT+MB+TROPI) -  Abnormal; Notable for the following:    CK, MB 8.5 (*)    Troponin I 1.44 (*)    Relative Index 7.3 (*)    All other components within normal limits  DIFFERENTIAL   Dg Chest 2 View  07/02/2011  *RADIOLOGY REPORT*  Clinical Data: Chest pain.  CHEST - 2 VIEW  Comparison: Plain films of the chest 06/07/2006 and CT chest 07/08/2006.  Findings: Chronic peribronchial thickening is unchanged.  There is no focal airspace disease or effusion.  Heart size is normal.  No focal bony abnormality.  IMPRESSION: No acute finding.  Stable compared to prior exam.  Original Report Authenticated By: Bernadene Bell. D'ALESSIO, M.D.     1. NSTEMI (non-ST elevated myocardial infarction)   2. Hyperglycemia      MDM  The patient pw chest pain. H/o NIDDM, concompliant, HLD. EKG abnormal, as above. ASA, morphine, nitroglycerin, labs, CXR. Repeat EKG. Reassess.  [id number] Patient states pain 0.5/10 at this time. Feels much better. Labs reviewed. C/W NSTEMI. Heparin ordered. Will order additional NTG sl at this time for her mild discomfort. Cardiology paged for admission.  [address] cardiology to see.  7:13 PM  Patient denies CP at this time.  Forbes Cellar, MD 07/02/11 [phone]

## 2011-07-02 NOTE — ED Notes (Signed)
Report given to Inetta Fermo, RN on yellow.  Patient to be moved to yellow, 31.

## 2011-07-02 NOTE — ED Notes (Signed)
Pt st's she had mid chest pain 1 week ago then subsided, had the pain again last pm with some nausea and diaphoresis.  Pt denies any pain at this time.  St's had pain earlier today.  Pt also c/o numbness in lower back.

## 2011-07-03 ENCOUNTER — Other Ambulatory Visit: Payer: Self-pay

## 2011-07-03 ENCOUNTER — Encounter (HOSPITAL_COMMUNITY)
Admission: EM | Disposition: A | Discharge status: Home / Self Care | Payer: Self-pay | Source: Home / Self Care | Attending: Cardiology

## 2011-07-03 HISTORY — PX: PERCUTANEOUS CORONARY STENT INTERVENTION (PCI-S): SHX5485

## 2011-07-03 HISTORY — PX: LEFT HEART CATHETERIZATION WITH CORONARY ANGIOGRAM: SHX5451

## 2011-07-03 LAB — BASIC METABOLIC PANEL
BUN: 11 mg/dL (ref 6–23)
Chloride: 98 mEq/L (ref 96–112)
GFR calc Af Amer: >90 mL/min (ref >90)
Potassium: 3.4 mEq/L — ABNORMAL LOW (ref 3.5–5.1)
Sodium: 133 mEq/L — ABNORMAL LOW (ref 135–145)

## 2011-07-03 LAB — CARDIAC PANEL(CRET KIN+CKTOT+MB+TROPI)
Relative Index: 7 — ABNORMAL HIGH (ref 0.0–2.5)
Relative Index: INVALID (ref 0.0–2.5)
Relative Index: INVALID (ref 0.0–2.5)
Total CK: 108 U/L (ref 7–177)
Total CK: 94 U/L (ref 7–177)
Troponin I: 4.4 ng/mL (ref <0.30)

## 2011-07-03 LAB — GLUCOSE, CAPILLARY
Glucose-Capillary: 193 mg/dL — ABNORMAL HIGH (ref 70–99)
Glucose-Capillary: 132 mg/dL — ABNORMAL HIGH (ref 70–99)

## 2011-07-03 LAB — HEPATIC FUNCTION PANEL
ALT: 19 U/L (ref 0–35)
AST: 22 U/L (ref 0–37)
Albumin: 3.2 g/dL — ABNORMAL LOW (ref 3.5–5.2)
Bilirubin, Direct: <0.1 mg/dL (ref 0.0–0.3)
Total Protein: 6.5 g/dL (ref 6.0–8.3)

## 2011-07-03 LAB — LIPID PANEL
Cholesterol: 203 mg/dL — ABNORMAL HIGH (ref 0–200)
Triglycerides: 214 mg/dL — ABNORMAL HIGH (ref <150)

## 2011-07-03 LAB — CBC
HCT: 42.2 % (ref 36.0–46.0)
Hemoglobin: 15 g/dL (ref 12.0–15.0)
MCV: 82.1 fL (ref 78.0–100.0)
RBC: 5.14 MIL/uL — ABNORMAL HIGH (ref 3.87–5.11)
RDW: 13.8 % (ref 11.5–15.5)
WBC: 9 10*3/uL (ref 4.0–10.5)

## 2011-07-03 LAB — HEMOGLOBIN A1C: Mean Plasma Glucose: 286 mg/dL — ABNORMAL HIGH (ref <117)

## 2011-07-03 LAB — HEPARIN LEVEL (UNFRACTIONATED): Heparin Unfractionated: 0.34 IU/mL (ref 0.30–0.70)

## 2011-07-03 SURGERY — PERCUTANEOUS CORONARY STENT INTERVENTION (PCI-S)
Anesthesia: LOCAL | Laterality: Right

## 2011-07-03 MED ORDER — TICAGRELOR 90 MG PO TABS
90.0000 mg | ORAL_TABLET | Freq: Two times a day (BID) | ORAL | Status: DC
  Administered 2011-07-03 – 2011-07-05 (×4): 90 mg via ORAL
  Filled 2011-07-03 (×6): qty 1

## 2011-07-03 MED ORDER — SODIUM CHLORIDE 0.9 % IV SOLN
1.0000 mL/kg/h | INTRAVENOUS | Status: AC
  Administered 2011-07-03 (×2): 1 mL/kg/h via INTRAVENOUS

## 2011-07-03 MED ORDER — VERAPAMIL HCL 2.5 MG/ML IV SOLN
INTRAVENOUS | Status: AC
  Filled 2011-07-03: qty 2

## 2011-07-03 MED ORDER — HEPARIN SODIUM (PORCINE) 1000 UNIT/ML IJ SOLN
INTRAMUSCULAR | Status: AC
  Filled 2011-07-03: qty 1

## 2011-07-03 MED ORDER — ACETAMINOPHEN 325 MG PO TABS: 650.0000 mg | ORAL_TABLET | ORAL | Status: DC | PRN

## 2011-07-03 MED ORDER — TICAGRELOR 90 MG PO TABS
ORAL_TABLET | ORAL | Status: AC
  Administered 2011-07-04: 90 mg via ORAL
  Filled 2011-07-03: qty 2

## 2011-07-03 MED ORDER — DIAZEPAM 5 MG PO TABS
10.0000 mg | ORAL_TABLET | ORAL | Status: AC
  Administered 2011-07-03: 10 mg via ORAL
  Filled 2011-07-03: qty 2

## 2011-07-03 MED ORDER — OXYCODONE-ACETAMINOPHEN 5-325 MG PO TABS: 1.0000 | ORAL_TABLET | ORAL | Status: DC | PRN

## 2011-07-03 MED ORDER — SODIUM CHLORIDE 0.9 % IV SOLN: INTRAVENOUS | Status: DC

## 2011-07-03 MED ORDER — MIDAZOLAM HCL 2 MG/2ML IJ SOLN
INTRAMUSCULAR | Status: AC
  Filled 2011-07-03: qty 2

## 2011-07-03 MED ORDER — HEPARIN (PORCINE) IN NACL 2-0.9 UNIT/ML-% IJ SOLN
INTRAMUSCULAR | Status: AC
  Filled 2011-07-03: qty 2000

## 2011-07-03 MED ORDER — ASPIRIN 81 MG PO CHEW: 81.0000 mg | CHEWABLE_TABLET | Freq: Every day | ORAL | Status: DC

## 2011-07-03 MED ORDER — ONDANSETRON HCL 4 MG/2ML IJ SOLN: 4.0000 mg | Freq: Four times a day (QID) | INTRAMUSCULAR | Status: DC | PRN

## 2011-07-03 MED ORDER — SODIUM CHLORIDE 0.9 % IV SOLN
0.2500 mg/kg/h | INTRAVENOUS | Status: DC
  Administered 2011-07-03: 0.25 mg/kg/h via INTRAVENOUS
  Filled 2011-07-03: qty 250

## 2011-07-03 MED ORDER — ASPIRIN 81 MG PO CHEW
CHEWABLE_TABLET | ORAL | Status: AC
  Filled 2011-07-03: qty 3

## 2011-07-03 MED ORDER — BIVALIRUDIN 250 MG IV SOLR
INTRAVENOUS | Status: AC
  Filled 2011-07-03: qty 250

## 2011-07-03 MED ORDER — NITROGLYCERIN IN D5W 200-5 MCG/ML-% IV SOLN
3.0000 ug/min | INTRAVENOUS | Status: DC
  Administered 2011-07-03: 3 ug/min via INTRAVENOUS
  Filled 2011-07-03: qty 250

## 2011-07-03 MED ORDER — FENTANYL CITRATE 0.05 MG/ML IJ SOLN
INTRAMUSCULAR | Status: AC
  Filled 2011-07-03: qty 2

## 2011-07-03 MED ORDER — NITROGLYCERIN 0.2 MG/ML ON CALL CATH LAB
INTRAVENOUS | Status: AC
  Filled 2011-07-03: qty 1

## 2011-07-03 MED ORDER — SODIUM CHLORIDE 0.9 % IV SOLN
INTRAVENOUS | Status: DC
  Administered 2011-07-03: 13:00:00 via INTRAVENOUS

## 2011-07-03 MED ORDER — ZOLPIDEM TARTRATE 5 MG PO TABS: 5.0000 mg | ORAL_TABLET | Freq: Every evening | ORAL | Status: DC | PRN

## 2011-07-03 MED ORDER — LIDOCAINE HCL (PF) 1 % IJ SOLN
INTRAMUSCULAR | Status: AC
  Filled 2011-07-03: qty 30

## 2011-07-03 MED ORDER — ALPRAZOLAM 0.25 MG PO TABS: 0.2500 mg | ORAL_TABLET | Freq: Two times a day (BID) | ORAL | Status: DC | PRN

## 2011-07-03 NOTE — Progress Notes (Signed)
Subjective:  Admitted with nonSTEMI.  Mult risks including diabetic.  Recurrent pain this am relieved with NTG. Trop and MB positive  Objective:  Vital Signs in the last 24 hours: BP 111/76  Pulse 74  Temp(Src) 98 F (36.7 C) (Oral)  Resp 16  Ht 5\' 3"  (1.6 m)  Wt 82.4 kg (181 lb 10.5 oz)  BMI 32.18 kg/m2  SpO2 94%  Physical Exam: Pleasant WF in NAD Lungs:  Clear to A&P Cardiac:  Regular rhythm, normal S1 and S2, no S3 Extremities:  No edema present Pulses 2+  Intake/Output from previous day: 02/01 0701 - 02/02 0700 In: 140.2 [I.V.:140.2] Out: -    Lab Results: Basic Metabolic Panel:  Basename 07/03/11 0500 07/02/11 1724  NA 133* 136  K 3.4* 3.6  CL 98 97  CO2 30 28  GLUCOSE 266* 255*  BUN 11 10  CREATININE 0.69 0.56   CBC:  Basename 07/03/11 0500 07/02/11 1724  WBC 9.0 8.1  NEUTROABS -- 4.8  HGB 15.0 16.7*  HCT 42.2 46.5*  MCV 82.1 81.4  PLT 252 242   Cardiac Enzymes:  Basename 07/03/11 0500 07/03/11 0038 07/02/11 1724  CKTOTAL 94 108 117  CKMB 6.5* 7.6* 8.5*  CKMBINDEX -- -- --  TROPONINI 3.93* 4.40* 1.44*   Telemetry: Reviewed :  Sinus  EKG:  ST depression in anterior leads  Assessment/Plan:  1. Non STEMI with ongoing pain and positive enzymes  Plan:  Urgent cath today by Dr. Katrinka Blazing.  Begin IV NTG.  Cardiac catheterization was discussed with the patient fully including risks of myocardial infarction, death, stroke, bleeding, arrhythmia, dye allergy, renal insufficiency or bleeding.  The patient understands and is willing to proceed. Possibility of PCI as well as radial or femoral approach discussed with her.  Darden Palmer.  MD [facility] 07/03/2011, 11:28 AM

## 2011-07-03 NOTE — Progress Notes (Addendum)
ANTICOAGULATION CONSULT NOTE - Follow Up Consult  Pharmacy Consult for heparin Indication: chest pain/ACS  Labs:  Basename 07/03/11 1204 07/03/11 0500 07/03/11 0038 07/02/11 1724  HGB -- 15.0 -- 16.7*  HCT -- 42.2 -- 46.5*  PLT -- 252 -- 242  APTT -- -- -- --  LABPROT -- -- -- --  INR -- -- -- --  HEPARINUNFRC 0.38 -- 0.34 --  CREATININE -- 0.69 -- 0.56  CKTOTAL 80 94 108 --  CKMB 5.0* 6.5* 7.6* --  TROPONINI 1.24* 3.93* 4.40* --   Estimated Creatinine Clearance: 84.6 ml/min (by C-G formula based on Cr of 0.69).  Assessment: 51yo female  on heparin IV drip at rate of 1300 units/hr for CP/NSTEMI. Today's heparin level remains therapeutic with heparin level = 0.38. CBC this AM stable. No bleeding reported.   Goal of Therapy:  Heparin level 0.3-0.7 units/ml   Plan:  Will continue heparin at current rate 1300 units/hr and check heparin level, CBC in AM daily while on IV heparin.    Arman Filter , RPh   07/03/2011,1:14 PM   Will follow up after cardiac cath today. Noah Delaine [facility] 07/03/2011 13:33

## 2011-07-03 NOTE — Progress Notes (Signed)
Patient Monica Michael, 51 year old white female is going to the cath lab for an examination.  She feels some anxiety about the procedure; but enjoys the emotional support of her son.  Patient expressed appreciation for the Chaplain's provision of pastoral presence, conversation, and prayer.  No follow-up needed.

## 2011-07-03 NOTE — Op Note (Signed)
     Diagnostic Cardiac Catheterization and PCI Report  Monica Michael  51 y.o.  female 1960-07-01  Procedure Date: 07/03/2011 Referring Physician: Illene Regulus, MD Primary Cardiologist:: Gwynneth Albright, M.D.   PROCEDURE:  Left heart catheterization with selective coronary angiography, left ventriculogram, and DES stent mid circumflex.  INDICATIONS: Non-ST elevation myocardial infarction with recurring pain at rest on IV nitroglycerin and optimal anticoagulation therapy. Elevated cardiac markers.  The risks, benefits, and details of the procedure were explained to the patient.  The patient verbalized understanding and wanted to proceed.  Informed written consent was obtained.  PROCEDURE TECHNIQUE:  After Xylocaine anesthesia a 5 French sheath was placed in the right radial artery with a single anterior needle wall stick.   Coronary angiography was done using a 5 [place] A2 MP, 5 [place] JR 4 catheter.  Left ventriculography was done using a 5 [place] A2 MP catheter. 4000 units of IV heparin was administered after a right radial access was achieved.  A 5 [place] EBU coronary guide catheter was then used to perform PCI of the circumflex. Bivalirudin bolus and infusion was then given. ACT was documented to be greater than 600. The patient was loaded with Brilinta orally.   CONTRAST:  Total of 165 cc.  COMPLICATIONS:  None.    HEMODYNAMICS:  Aortic pressure was 107/67 mmHg; LV pressure was 115/81mm mercury; LVEDP 13 mm mercury.  There was no gradient between the left ventricle and aorta.    ANGIOGRAPHIC DATA:   The left main coronary artery is short and widely patent.  The left anterior descending artery is proximal eccentric 50-70% stenosis.  The left circumflex artery is segmental 95% stenosis before the origin of the first obtuse marginal. The stenosis is in the mid vessel..  The right coronary artery is dominant and contains ostial 50% narrowing.Marland Kitchen  LEFT VENTRICULOGRAM:  Left  ventricular angiogram was done in the 30 RAO projection and revealed normal left ventricular wall motion and systolic function with an estimated ejection fraction of 60%.    PCI REPORT : The circumflex prior to the bifurcation into the first and second marginals contains an eccentric 95% stenosis in the distal portion of the diffuse disease segment of approximately 12 mm length. This region was successfully angioplastied and stented with a drug-eluting resolute 3.0 x 15 mm long stent. Post dilatation to 3.5 mm diameter at 12 atmospheres was performed. A beautiful angiographic result was obtained the  IMPRESSIONS:  1. Non-ST elevation myocardial infarction due to 2 high-grade obstruction in the mid circumflex, greater than 95%. TIMI grade 3 flow was noted however.  2. Successful DES implantation in the mid circumflex reducing the 95% stenosis to 0% with TIMI grade 3 flow.  3. Eccentric intermediate proximal LAD stenosis of 50-70%. Ostial 40-50% RCA stenosis.  4. Normal left ventricular function.   RECOMMENDATION:  1. Brilinta and aspirin x12 months.  2. Aggressive diabetes control and typical risk factor modification.  3. Consideration of LAD evaluation with nuclear study at some point in the future.  4. Further management per the [place] team. Clinically the patient could be discharged tomorrow, 07/04/2011, if no complications.

## 2011-07-03 NOTE — H&P (Signed)
Dr. Donnie Aho asked that the patient undergo coronary angiography. She was admitted to the hospital with a non-ST elevation myocardial infarction. She had pain this morning while Dr. Donnie Aho was on rounds. The pain occurs spontaneously. She's been having progressive recurring episodes of angina over the past 2 weeks. She is diabetic and smoke cigarettes. She has not been on her medications. Troponins are elevated. Total CPKs are normal. EKG reveals nonspecific changes. No ischemic ST-T wave abnormalities are noted.  Cardiac catheterization, PCI, and the possibility of surgery (elective/emergent) were discussed with the patient in detail. Risks including death, bleeding, stroke, allergy, renal impairment, limb ischemia, among others were discussed in detail and except about the patient. Informed consent was obtained.

## 2011-07-03 NOTE — Progress Notes (Signed)
ANTICOAGULATION CONSULT NOTE - Follow Up Consult  Pharmacy Consult for heparin Indication: chest pain/ACS  Labs:  [facility] 07/03/11 0038 07/02/11 1724  HGB -- 16.7*  HCT -- 46.5*  PLT -- 242  APTT -- --  LABPROT -- --  INR -- --  HEPARINUNFRC 0.34 --  CREATININE -- 0.56  CKTOTAL -- 117  CKMB -- 8.5*  TROPONINI -- 1.44*   Estimated Creatinine Clearance: 84.4 ml/min (by C-G formula based on Cr of 0.56).  Assessment: 51yo female therapeutic on heparin with initial dosing for CP.  Goal of Therapy:  Heparin level 0.3-0.7 units/ml   Plan:  Will continue heparin at current rate and confirm stable with additional level.  Colleen Can PharmD BCPS 07/03/2011,1:25 AM

## 2011-07-03 NOTE — Brief Op Note (Signed)
07/02/2011 - 07/03/2011  3:41 PM  PATIENT:  Monica Michael  51 y.o. female  PRE-OPERATIVE DIAGNOSIS:  Non-stemi  POST-OPERATIVE DIAGNOSIS: DES mid circumflex  PROCEDURE:  Procedure(s): LEFT HEART CATHETERIZATION WITH CORONARY ANGIOGRAM PERCUTANEOUS CORONARY STENT INTERVENTION (PCI-S)  SURGEON:  Surgeon(s): Lesleigh Noe, MD  PHYSICIAN ASSISTANT:   ASSISTANTS: none

## 2011-07-04 DIAGNOSIS — E785 Hyperlipidemia, unspecified: Secondary | ICD-10-CM

## 2011-07-04 DIAGNOSIS — Z720 Tobacco use: Secondary | ICD-10-CM

## 2011-07-04 DIAGNOSIS — I214 Non-ST elevation (NSTEMI) myocardial infarction: Principal | ICD-10-CM

## 2011-07-04 LAB — GLUCOSE, CAPILLARY
Glucose-Capillary: 183 mg/dL — ABNORMAL HIGH (ref 70–99)
Glucose-Capillary: 209 mg/dL — ABNORMAL HIGH (ref 70–99)

## 2011-07-04 LAB — CBC
HCT: 42.5 % (ref 36.0–46.0)
MCHC: 34.4 g/dL (ref 30.0–36.0)
MCV: 83.2 fL (ref 78.0–100.0)
RDW: 13.9 % (ref 11.5–15.5)

## 2011-07-04 LAB — BASIC METABOLIC PANEL
BUN: 10 mg/dL (ref 6–23)
Creatinine, Ser: 0.58 mg/dL (ref 0.50–1.10)
GFR calc non Af Amer: >90 mL/min (ref >90)
Glucose, Bld: 161 mg/dL — ABNORMAL HIGH (ref 70–99)
Potassium: 3.6 mEq/L (ref 3.5–5.1)

## 2011-07-04 MED ORDER — LISINOPRIL 10 MG PO TABS
10.0000 mg | ORAL_TABLET | Freq: Every day | ORAL | Status: DC
  Administered 2011-07-04 – 2011-07-05 (×2): 10 mg via ORAL
  Filled 2011-07-04 (×2): qty 1

## 2011-07-04 MED ORDER — CARVEDILOL 6.25 MG PO TABS
6.2500 mg | ORAL_TABLET | Freq: Two times a day (BID) | ORAL | Status: DC
  Administered 2011-07-04 – 2011-07-05 (×3): 6.25 mg via ORAL
  Filled 2011-07-04 (×5): qty 1

## 2011-07-04 NOTE — Progress Notes (Signed)
Patient ambulated on portable monitor around the entire unit.  SR 90s on the monitor without ectopy.  Denies CP/SOB.  Patient states, "I am ready to go home".

## 2011-07-04 NOTE — Progress Notes (Signed)
Subjective:   Monica Michael is a 51 y/o woman with h/o DM2 and tobacco use. Admitted with NSTEMI.  Underwent PCI LCX yesterday by Dr. Katrinka Blazing. Doing well no CP, SOB.  Ambulating room. Eager to go home.     Intake/Output Summary (Last 24 hours) at 07/04/11 0755 Last data filed at 07/04/11 0515  Gross per 24 hour  Intake 1171.8 ml  Output    300 ml  Net  871.8 ml    Current meds:    . aspirin      . aspirin EC  81 mg Oral Daily  . bivalirudin      . diazepam  10 mg Oral On Call  . fentaNYL      . heparin      . heparin      . insulin aspart  0-9 Units Subcutaneous TID WC  . lidocaine      . metoprolol tartrate  12.5 mg Oral BID  . midazolam      . nitroGLYCERIN      . rosuvastatin  40 mg Oral Daily  . Ticagrelor      . Ticagrelor  90 mg Oral BID  . verapamil      . DISCONTD: aspirin  81 mg Oral Daily  . DISCONTD: sodium chloride  3 mL Intravenous Q12H   Infusions:    . sodium chloride Stopped (07/04/11 0515)  . DISCONTD: sodium chloride 50 mL/hr at 07/03/11 1238  . DISCONTD: sodium chloride    . DISCONTD: bivalirudin (ANGIOMAX) infusion 5 mg/mL (Cath Lab,ACS,PCI indication) 0.25 mg/kg/hr (07/03/11 1630)  . DISCONTD: heparin 1,300 Units/hr (07/02/11 1913)  . DISCONTD: nitroGLYCERIN 3 mcg/min (07/03/11 1212)     Objective:  Blood pressure 130/75, pulse 64, temperature 98 F (36.7 C), temperature source Oral, resp. rate 11, height 5\' 3"  (1.6 m), weight 82.4 kg (181 lb 10.5 oz), SpO2 97.00%. Weight change:   Physical Exam: General:  Well appearing. No resp difficulty HEENT: normal Neck: supple. JVP . Carotids 2+ bilat; no bruits. No lymphadenopathy or thryomegaly appreciated. Cor: PMI nondisplaced. Regular rate & rhythm. No rubs, gallops or murmurs. Lungs: clear Abdomen: soft, nontender, nondistended. No hepatosplenomegaly. No bruits or masses. Good bowel sounds. Extremities: no cyanosis, clubbing, rash, edema. Radial site ok. No bruit.  Neuro: alert &  orientedx3, cranial nerves grossly intact. moves all 4 extremities w/o difficulty. Affect pleasant  Telemetry: SR 70s  Lab Results: Basic Metabolic Panel:  Lab 07/04/11 1610 07/03/11 0500 07/02/11 1724  NA 140 133* 136  K 3.6 3.4* --  CL 106 98 97  CO2 24 30 28   GLUCOSE 161* 266* 255*  BUN 10 11 10   CREATININE 0.58 0.69 0.56  CALCIUM 8.4 9.0 10.4  MG -- -- --  PHOS -- -- --   Liver Function Tests:  Lab 07/03/11 0038  AST 22  ALT 19  ALKPHOS 109  BILITOT 0.4  PROT 6.5  ALBUMIN 3.2*   No results found for this basename: LIPASE:5,AMYLASE:5 in the last 168 hours No results found for this basename: AMMONIA:5 in the last 168 hours CBC:  Lab 07/04/11 0500 07/03/11 0500 07/02/11 1724  WBC 8.5 9.0 8.1  NEUTROABS -- -- 4.8  HGB 14.6 15.0 16.7*  HCT 42.5 42.2 46.5*  MCV 83.2 82.1 81.4  PLT 240 252 242   Cardiac Enzymes:  Lab 07/03/11 1204 07/03/11 0500 07/03/11 0038 07/02/11 1724  CKTOTAL 80 94 108 117  CKMB 5.0* 6.5* 7.6* 8.5*  CKMBINDEX -- -- -- --  TROPONINI 1.24* 3.93* 4.40* 1.44*   BNP: No components found with this basename: POCBNP:5 CBG:  Lab 07/03/11 2208 07/03/11 1706 07/03/11 1218 07/03/11 0734 07/02/11 2258  GLUCAP 132* 151* 193* 273* 254*   Microbiology: No results found for this basename: cult   No results found for this basename: CULT:2,SDES:2 in the last 168 hours  Imaging: Dg Chest 2 View  07/02/2011  *RADIOLOGY REPORT*  Clinical Data: Chest pain.  CHEST - 2 VIEW  Comparison: Plain films of the chest 06/07/2006 and CT chest 07/08/2006.  Findings: Chronic peribronchial thickening is unchanged.  There is no focal airspace disease or effusion.  Heart size is normal.  No focal bony abnormality.  IMPRESSION: No acute finding.  Stable compared to prior exam.  Original Report Authenticated By: Bernadene Bell. D'ALESSIO, M.D.     ASSESSMENT:  1. NSTEMI - s/p DES LCX 2/2. Peak trop 4.4 2. 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 DM2 4. Tobacco  use  PLAN/DISCUSSION:  She is doing well s/p PCI LCX. Will continue Brillinta, b-blocker, statin and ASA. Cardiac rehab to see. To tele today. Home tomorrow. Need for smoking cessation reinforced.    LOS: 2 days    [name], MD 07/04/2011, 7:55 AM

## 2011-07-05 MED ORDER — ASPIRIN 81 MG PO TBEC: 81.0000 mg | DELAYED_RELEASE_TABLET | Freq: Every day | ORAL | Status: DC

## 2011-07-05 MED ORDER — LISINOPRIL 10 MG PO TABS: 10.0000 mg | ORAL_TABLET | Freq: Every day | ORAL | Status: DC

## 2011-07-05 MED ORDER — TICAGRELOR 90 MG PO TABS: 90.0000 mg | ORAL_TABLET | Freq: Two times a day (BID) | ORAL | Status: DC

## 2011-07-05 MED ORDER — CARVEDILOL 6.25 MG PO TABS: 6.2500 mg | ORAL_TABLET | Freq: Two times a day (BID) | ORAL | Status: DC

## 2011-07-05 MED ORDER — NITROGLYCERIN 0.4 MG SL SUBL: 0.4000 mg | SUBLINGUAL_TABLET | SUBLINGUAL | Status: DC | PRN

## 2011-07-05 MED ORDER — ROSUVASTATIN CALCIUM 40 MG PO TABS: 40.0000 mg | ORAL_TABLET | Freq: Every day | ORAL | Status: DC

## 2011-07-05 MED ORDER — ASPIRIN 81 MG PO TBEC: 81.0000 mg | DELAYED_RELEASE_TABLET | Freq: Every day | ORAL | Status: AC

## 2011-07-05 NOTE — Consult Note (Signed)
Pt is a 1 ppd smoker and is very eager to quit and in action stage. Has quit in the past with patches and would like to use patches again. Recommended 21 mg patch x 6 weeks, 14 mg patch x 2 weeks and the 7 mg patch x 2 weeks. Discussed and wrote down patch instructions for the pt. Referred to 1-800 quit now for f/u and support. Discussed oral fixation substitutes, second hand smoke and in home smoking policy. Reviewed and gave pt Written education/contact information.

## 2011-07-05 NOTE — Discharge Summary (Signed)
Discharge Summary   Patient ID: Monica Michael MRN: [id number], DOB/AGE: 02-Feb-1961 51 y.o.  Primary MD: Illene Regulus, MD Primary Cardiologist: Willa Rough MD  Admit date: 07/02/2011 D/C date:     07/05/2011     Primary Discharge Diagnoses:  1. NSTEMI w/ newly diagnosed CAD  - cardiac cath 07/03/11 prox LAD 50-70%, mid LCx 95%, RCA osital 50%, EF 60%, s/p DES to mid LCx  - Consideration of LAD evaluation w/ nuclear study in the future  - DAPT w/ Brilinta and ASA  Secondary Discharge Diagnoses:  1. Hyperlipidemia  - LDL 135, initiated on Crestor 2. Diabetes Mellitus, Type 2  - A1C 11.6 3. Tobacco Abuse  Allergies No Known Allergies  Diagnostic Studies/Procedures:   07/03/11 - Left heart catheterization with selective coronary angiography, left ventriculogram, and DES stent mid circumflex HEMODYNAMICS: Aortic pressure was 107/67 mmHg; LV pressure was 115/47mm mercury; LVEDP 13 mm mercury. There was no gradient between the left ventricle and aorta.  ANGIOGRAPHIC DATA: The left main coronary artery is short and widely patent.  The left anterior descending artery is proximal eccentric 50-70% stenosis.  The left circumflex artery is segmental 95% stenosis before the origin of the first obtuse marginal. The stenosis is in the mid vessel..  The right coronary artery is dominant and contains ostial 50% narrowing.Marland Kitchen  LEFT VENTRICULOGRAM: Left ventricular angiogram was done in the 30 RAO projection and revealed normal left ventricular wall motion and systolic function with an estimated ejection fraction of 60%.  IMPRESSIONS: 1. Non-ST elevation myocardial infarction due to 2 high-grade obstruction in the mid circumflex, greater than 95%. TIMI grade 3 flow was noted however.  2. Successful drug-eluting resolute 3.0 x 15 mm long stent implantation in the mid circumflex reducing the 95% stenosis to 0% with TIMI grade 3 flow.  3. Eccentric intermediate proximal LAD stenosis of 50-70%. Ostial  40-50% RCA stenosis.  4. Normal left ventricular function.  RECOMMENDATION: 1. Brilinta and aspirin x12 months.  2. Aggressive diabetes control and typical risk factor modification.  3. Consideration of LAD evaluation with nuclear study at some point in the future.    History of Present Illness: 51 y.o. female w/ PMHx significant for ongoing tobacco abuse, untreated DMII, and untreated HLD, no known cardiac history who presented to [facility] on 07/02/11 with complaints of chest pain.  She is followed by Dr. Debby Bud (PCP) but has not seen him in the last 71yrs and has not filled prescriptions for metformin or simvastatin. One day prior to presentation she had intermittent chest pain that continued during the night that prompted her to present to the ED.  Hospital Course: In the ED her chest pain was relieved with NTG and Morphine. Initial cardiac enzymes were positive. EKG revealed NSR with mild nonspecific ST-T wave changes with some anterior ST depression. CXR was without acute cardiopulmonary abnormalities. Labs were significant for Glucose 255 and troponin 1.44. She ruled in for NSTEMI and was admitted for further evaluation and treatment.  Cardiac enzymes remained positive with peak troponin 4.4 and she had recurrent chest pain. She underwent urgent cardiac cath on 07/03/11 with findings as noted above and placement of DES to LCx. Recommendations were made for DAPT with Brilinta and ASA for at least 12 months, aggressive risk factor modification, and consideration for further LAD evaluation with nuclear imaging at some point in the future. She tolerated the procedure well without complications and was able to ambulate with cardiac rehab without chest pain or  shortness of breath. She received tobacco cessation counseling.  She was seen and evaluated by Dr. Myrtis Ser who felt she was stable for discharge home with plans for follow up as scheduled below. She was instructed to follow up with her PCP as  soon as possible for diabetes management.   Discharge Vitals: Blood pressure 114/67, pulse 69, temperature 97.8 F (36.6 C), temperature source Oral, resp. rate 17, height 5\' 3"  (1.6 m), weight 181 lb 10.5 oz (82.4 kg), SpO2 95.00%.  Labs: Component Value Date   WBC 8.5 07/04/2011   HGB 14.6 07/04/2011   HCT 42.5 07/04/2011   MCV 83.2 07/04/2011   PLT 240 07/04/2011    Lab 07/04/11 0500 07/03/11 0038  NA 140 --  K 3.6 --  CL 106 --  CO2 24 --  BUN 10 --  CREATININE 0.58 --  CALCIUM 8.4 --  PROT -- 6.5  BILITOT -- 0.4  ALKPHOS -- 109  ALT -- 19  AST -- 22  GLUCOSE 161* --   Basename 07/03/11 1204 07/03/11 0500 07/03/11 0038 07/02/11 1724  CKTOTAL 80 94 108 117  CKMB 5.0* 6.5* 7.6* 8.5*  TROPONINI 1.24* 3.93* 4.40* 1.44*   Component Value Date   CHOL 203* 07/03/2011   HDL 25* 07/03/2011   LDLCALC 135* 07/03/2011   TRIG 214* 07/03/2011    07/03/2011 00:38  Hemoglobin A1C 11.6 (H)     07/03/2011 00:38  TSH 1.651    Discharge Medications   Medication List  As of 07/05/2011 10:08 AM   STOP taking these medications         naproxen sodium 220 MG tablet         TAKE these medications         aspirin 81 MG EC tablet   Take 1 tablet (81 mg total) by mouth daily.      carvedilol 6.25 MG tablet   Commonly known as: COREG   Take 1 tablet (6.25 mg total) by mouth 2 (two) times daily with a meal.      lisinopril 10 MG tablet   Commonly known as: PRINIVIL,ZESTRIL   Take 1 tablet (10 mg total) by mouth daily.      nitroGLYCERIN 0.4 MG SL tablet   Commonly known as: NITROSTAT   Place 1 tablet (0.4 mg total) under the tongue every 5 (five) minutes x 3 doses as needed for chest pain (up to 3 doses).      rosuvastatin 40 MG tablet   Commonly known as: CRESTOR   Take 1 tablet (40 mg total) by mouth daily.      Ticagrelor 90 MG Tabs tablet   Commonly known as: BRILINTA   Take 1 tablet (90 mg total) by mouth 2 (two) times daily.            Disposition   Discharge Orders     Future Appointments: Provider: Department: Dept Phone: Center:   07/08/2011 11:00 AM Duke Salvia, MD [facility] [phone] [facility]   07/23/2011 11:00 AM Beatrice Lecher, PA [facility] [phone] [facility]     Future Orders Please Complete By Expires   Diet - low sodium heart healthy      Increase activity slowly      Discharge instructions      Comments:   *PLEASE REMEMBER TO BRING ALL OF YOUR MEDICATIONS TO EACH OF YOUR FOLLOW-UP OFFICE VISITS.  * KEEP WRIST CATHETERIZATION SITE CLEAN AND DRY. Call the office for any signs of bleeding, pus,  swelling, increased pain, or any other concerns. * NO HEAVY LIFTING (>10lbs) X 2 WEEKS. * NO SEXUAL ACTIVITY X 2 WEEKS. * NO DRIVING X 1 WEEK. * NO SOAKING BATHS, HOT TUBS, POOLS, ETC., X 7 DAYS.  * You were started on a cholesterol medication during this hospitalization and will need follow up labs for your cholesterol and liver in 6-8 weeks.  * Please stop smoking! * Follow up with you primary care physician as soon as possible for management of your diabetes       Follow-up Information    Follow up with Illene Regulus, MD on 07/08/2011. (11:00)       Follow up with Tereso Newcomer, PA on 07/23/2011. (11:00)    Contact information:   [facility] [address] [place] [address] [phone]           Outstanding Labs/Studies:  1. * Patient will require follow up lipid panel and liver function tests in 6-8 weeks as we initiated a new statin during this hospitalization  2. Consideration of LAD evaluation w/ nuclear study in the future  Duration of Discharge Encounter: Greater than 30 minutes including physician and PA time.  Signed, HOPE, JESSICA PA-C 07/05/2011, 10:08 AM  Patient seen and examined. I agree with the assessment and plan as detailed above. See also my additional thoughts below.   The patient is ready to go home. See my progress note from today also  Willa Rough,  MD, [facility] 07/05/2011 1:21 PM

## 2011-07-05 NOTE — Progress Notes (Signed)
Cardiac Rehab [phone] Education completed with pt. Strongly encouraged adherence to diabetic diet and followup with medical doctor. Encouraged smoking cessation. Permission given to refer to [facility]. Cally Nygard DunlapRN

## 2011-07-05 NOTE — Progress Notes (Addendum)
Patient ID: Monica Michael, female   DOB: [birth year], 51 y.o.   MRN: [id number] SUBJECTIVE Patient feels very good today. She was admitted with unstable pain and stabilized. The next day she had some additional pain and she was taken to the cath lab with stents placed by Dr. Katrinka Blazing. The procedure was done from her arm. She's doing extremely well. Original symptom was burning in her upper chest and neck. She has not had anymore. I have talked at length with the patient about seeing her primary physician, Dr Debby Bud, in the near future. She has promised me that she will followup on this. I've also discussed with her the extreme importance of her secondary prevention. She must remain on dual antiplatelet therapy. She needs to stop smoking and follow through on other primary care issues.   Filed Vitals:   07/04/11 1100 07/04/11 1525 07/04/11 2015 07/05/11 0456  BP:  107/69 112/69 114/67  Pulse:  73 70 69  Temp:  97.6 F (36.4 C) 98.4 F (36.9 C) 97.8 F (36.6 C)  TempSrc:  Oral Oral Oral  Resp: 15 18 19 17   Height:      Weight:      SpO2:  95% 98% 95%    Intake/Output Summary (Last 24 hours) at 07/05/11 0739 Last data filed at 07/04/11 0900  Gross per 24 hour  Intake    360 ml  Output    450 ml  Net    -90 ml    LABS: Basic Metabolic Panel:  Basename 07/04/11 0500 07/03/11 0500  NA 140 133*  K 3.6 3.4*  CL 106 98  CO2 24 30  GLUCOSE 161* 266*  BUN 10 11  CREATININE 0.58 0.69  CALCIUM 8.4 9.0  MG -- --  PHOS -- --   Liver Function Tests:  Basename 07/03/11 0038  AST 22  ALT 19  ALKPHOS 109  BILITOT 0.4  PROT 6.5  ALBUMIN 3.2*   No results found for this basename: LIPASE:2,AMYLASE:2 in the last 72 hours CBC:  Basename 07/04/11 0500 07/03/11 0500 07/02/11 1724  WBC 8.5 9.0 --  NEUTROABS -- -- 4.8  HGB 14.6 15.0 --  HCT 42.5 42.2 --  MCV 83.2 82.1 --  PLT 240 252 --   Cardiac Enzymes:  Basename 07/03/11 1204 07/03/11 0500 07/03/11 0038  CKTOTAL 80 94 108  CKMB  5.0* 6.5* 7.6*  CKMBINDEX -- -- --  TROPONINI 1.24* 3.93* 4.40*   BNP: No components found with this basename: POCBNP:3 D-Dimer: No results found for this basename: DDIMER:2 in the last 72 hours Hemoglobin A1C:  Basename 07/03/11 0038  HGBA1C 11.6*   Fasting Lipid Panel:  Basename 07/03/11 0500  CHOL 203*  HDL 25*  LDLCALC 135*  TRIG 214*  CHOLHDL 8.1  LDLDIRECT --   Thyroid Function Tests:  Basename 07/03/11 0038  TSH 1.651  T4TOTAL --  T3FREE --  THYROIDAB --    RADIOLOGY: Dg Chest 2 View  07/02/2011  *RADIOLOGY REPORT*  Clinical Data: Chest pain.  CHEST - 2 VIEW  Comparison: Plain films of the chest 06/07/2006 and CT chest 07/08/2006.  Findings: Chronic peribronchial thickening is unchanged.  There is no focal airspace disease or effusion.  Heart size is normal.  No focal bony abnormality.  IMPRESSION: No acute finding.  Stable compared to prior exam.  Original Report Authenticated By: Bernadene Bell. Maricela Curet, M.D.    PHYSICAL EXAM  Patient is oriented to person time and place. Affect is normal. Head is atraumatic.  There is no xanthelasma. Lungs are clear. Respiratory effort is nonlabored. Cardiac exam reveals S1 and S2. There no clicks or significant murmurs. The abdomen is soft. Is no peripheral edema. The cath site in the right radial area is completely healed already.   TELEMETRY:   I personally reviewed telemetry. She has normal sinus rhythm.   ASSESSMENT AND PLAN:    *Non-STEMI (non-ST elevated myocardial infarction)   Patient is doing extremely well. She had stents placed on Saturday. She is ready to go home today.   DIAB W/O MENTION COMP TYPE II/UNS TYPE UNCNTRL   She is assured me that she will see her primary physician for early followup.   Hyperlipidemia   She is on appropriate medications.   Tobacco use   We are to spoken with the patient about smoking cessation. I'm not sure if the formal consult has been done yet. She cannot go home until there has  been formal smoking cessation input.   The plan will be for the patient to go home today. I will follow her with cardiology followup. She'll be seen in the office over the next several weeks. Her first visit may need to be with one of our PAs. I would like for her to have an appointment with Dr.Norins scheduled before she leaves the hospital today.  I have ordered a smoking cessation consult and cardiac rehabilitation consult for today. I apologize if these have already been ordered and I have missed them.   Willa Rough 07/05/2011 7:39 AM

## 2011-07-06 LAB — POCT ACTIVATED CLOTTING TIME: Activated Clotting Time: >1000 seconds

## 2011-07-08 ENCOUNTER — Ambulatory Visit (INDEPENDENT_AMBULATORY_CARE_PROVIDER_SITE_OTHER): Payer: 59 | Admitting: Internal Medicine

## 2011-07-08 ENCOUNTER — Encounter: Payer: Self-pay | Admitting: Internal Medicine

## 2011-07-08 VITALS — BP 122/80 | HR 76 | Temp 98.0°F | Resp 14 | Wt 176.0 lb

## 2011-07-08 DIAGNOSIS — I2510 Atherosclerotic heart disease of native coronary artery without angina pectoris: Secondary | ICD-10-CM

## 2011-07-08 DIAGNOSIS — Z720 Tobacco use: Secondary | ICD-10-CM

## 2011-07-08 DIAGNOSIS — F17200 Nicotine dependence, unspecified, uncomplicated: Secondary | ICD-10-CM

## 2011-07-08 DIAGNOSIS — E782 Mixed hyperlipidemia: Secondary | ICD-10-CM

## 2011-07-08 DIAGNOSIS — IMO0001 Reserved for inherently not codable concepts without codable children: Secondary | ICD-10-CM

## 2011-07-08 MED ORDER — METFORMIN HCL 1000 MG PO TABS: 1000.0000 mg | ORAL_TABLET | Freq: Two times a day (BID) | ORAL | Status: DC

## 2011-07-08 MED FILL — Dextrose Inj 5%: INTRAVENOUS | Qty: 50 | Status: AC

## 2011-07-08 NOTE — Progress Notes (Signed)
  Subjective:    Patient ID: Monica Michael, female    DOB: 1961-03-29, 51 y.o.   MRN: [id number]  HPI Mrs. Vanantwerp returns to the fold with a new outlook on managing her chronic diseases, diabete, lipids, blood pressure and tobacco abuse, after having a DES to Circ Feb 2, '13. She admits to poor adherence in regard to management of her diabetes, hyperlipidemia, smoking  and hypertension. She had a very tough time with the prolonged hospitalization of her husband who eventually died leading to a period of denial and self-neglect.   Past Medical History  Diagnosis Date  . Diabetes mellitus   . Hyperlipidemia   . Psoriasis   . Paget's disease of bone     Noted on chest CT scan  . Type II or unspecified type diabetes mellitus without mention of complication, uncontrolled   . Mixed hyperlipidemia   . Other disorders of bone and cartilage   . CAD in native artery Jan '13    cardiac cath with obstructive disease Cx - PTCA/PCI wth DES stent (Dr. Verdis Prime)   Past Surgical History  Procedure Date  . Abdominal hysterectomy    Family History  Problem Relation Age of Onset  . Heart failure Father 79    deceased  . Diabetes Father   . Kidney failure Father   . Hypertension Father   . Hyperlipidemia Father   . Coronary artery disease Father   . Hodgkin's lymphoma Mother 57    deceased  . Cancer Neg Hx     breast, colon   History   Social History  . Marital Status: Widowed    Spouse Name: N/A    Number of Children: 1  . Years of Education: N/A   Occupational History  .  [employer]   Social History Main Topics  . Smoking status: Current Everyday Smoker    Types: Cigarettes  . Smokeless tobacco: Never Used   Comment: discussed cessation strategy  . Alcohol Use: No  . Drug Use: No  . Sexually Active: Not Currently   Other Topics Concern  . Not on file   Social History Narrative   [employer]. Married '84-14 yrs; divorced; married '06 - 5 yers/widowed. 1 son - '89.  Work-quality reviewer in an office setting.       Review of Systems  Constitutional: Negative.   HENT: Negative.   Eyes: Negative.   Respiratory: Negative.   Cardiovascular: Negative.   Gastrointestinal: Negative.   Genitourinary: Negative.   Musculoskeletal: Negative.   Skin: Negative.   Neurological: Negative.   Hematological: Negative.   Psychiatric/Behavioral: Negative.        Objective:   Physical Exam Filed Vitals:   07/08/11 1119  BP: 122/80  Pulse: 76  Temp: 98 F (36.7 C)  Resp: 14  Weight: 176 lb (79.833 kg)  Gen'l- pleasant woman with a positive affect in no distress HEENT - C&S clear Cor - RRR Pulm - normal respirations Neuro - A&O x 3, CN II-XII grossly normal, normal gait        Assessment & Plan:

## 2011-07-08 NOTE — Patient Instructions (Signed)
Diabetes - life-style is the most important medication: NO sugar, low carb and take the metformin twice a day. Check blood sugar before breakfast 3 times a week and check 90 minutes after meals in a rotating fashion. Regular exercise - daily for 30 minutes with a target heart rate of 120   Cholesterol - goal is an LDL of 80 or less. Plan - Low Fat diet, exercise and take the crestor. Follow up lab in about 1 month.   Blood pressure - continue lisinopril and carvedilol  Tobacco abuse - an addiction. It take a strategic plan to stop smoking- it is a psychological addiction. Step 1 - keep a smoker's diary and rate the cigarettes: didn;'t know I lite it = 3; lite don't care too much = 2; get between me and this cigarette and someone will die = 1. Study the associate behavior with the 1's. Develop a alternative behavior plan. Step 2 - get help from friends etc, clean everything so it is smoke smell free, set a quit date and quit. May  Benefit from drugs - lets talk about it later.   A better healthier life-style is VERY DOABLE!! The rewards are IMMENSE.   Smoking Cessation, Tips for Success YOU CAN QUIT SMOKING If you are ready to quit smoking, congratulations! You have chosen to help yourself be healthier. Cigarettes bring nicotine, tar, carbon monoxide, and other irritants into your body. Your lungs, heart, and blood vessels will be able to work better without these poisons. There are many different ways to quit smoking. Nicotine gum, nicotine patches, a nicotine inhaler, or nicotine nasal spray can help with physical craving. Hypnosis, support groups, and medicines help break the habit of smoking. Here are some tips to help you quit for good.  Throw away all cigarettes.     Clean and remove all ashtrays from your home, work, and car.     On a card, write down your reasons for quitting. Carry the card with you and read it when you get the urge to smoke.     Cleanse your body of nicotine. Drink  enough water and fluids to keep your urine clear or pale yellow. Do this after quitting to flush the nicotine from your body.     Learn to predict your moods. Do not let a bad situation be your excuse to have a cigarette. Some situations in your life might tempt you into wanting a cigarette.     Never have "just one" cigarette. It leads to wanting another and another. Remind yourself of your decision to quit.     Change habits associated with smoking. If you smoked while driving or when feeling stressed, try other activities to replace smoking. Stand up when drinking your coffee. Brush your teeth after eating. Sit in a different chair when you read the paper. Avoid alcohol while trying to quit, and try to drink fewer caffeinated beverages. Alcohol and caffeine may urge you to smoke.     Avoid foods and drinks that can trigger a desire to smoke, such as sugary or spicy foods and alcohol.     Ask people who smoke not to smoke around you.     Have something planned to do right after eating or having a cup of coffee. Take a walk or exercise to perk you up. This will help to keep you from overeating.     Try a relaxation exercise to calm you down and decrease your stress. Remember, you may be tense  and nervous for the first 2 weeks after you quit, but this will pass.     Find new activities to keep your hands busy. Play with a pen, coin, or rubber band. Doodle or draw things on paper.     Brush your teeth right after eating. This will help cut down on the craving for the taste of tobacco after meals. You can try mouthwash, too.     Use oral substitutes, such as lemon drops, carrots, a cinnamon stick, or chewing gum, in place of cigarettes. Keep them handy so they are available when you have the urge to smoke.     When you have the urge to smoke, try deep breathing.     Designate your home as a nonsmoking area.     If you are a heavy smoker, ask your caregiver about a prescription for nicotine  chewing gum. It can ease your withdrawal from nicotine.     Reward yourself. Set aside the cigarette money you save and buy yourself something nice.     Look for support from others. Join a support group or smoking cessation program. Ask someone at home or at work to help you with your plan to quit smoking.     Always ask yourself, "Do I need this cigarette or is this just a reflex?" Tell yourself, "Today, I choose not to smoke," or "I do not want to smoke." You are reminding yourself of your decision to quit, even if you do smoke a cigarette.  HOW WILL I FEEL WHEN I QUIT SMOKING?  The benefits of not smoking start within days of quitting.     You may have symptoms of withdrawal because your body is used to nicotine (the addictive substance in cigarettes). You may crave cigarettes, be irritable, feel very hungry, cough often, get headaches, or have difficulty concentrating.     The withdrawal symptoms are only temporary. They are strongest when you first quit but will go away within 10 to 14 days.     When withdrawal symptoms occur, stay in control. Think about your reasons for quitting. Remind yourself that these are signs that your body is healing and getting used to being without cigarettes.     Remember that withdrawal symptoms are easier to treat than the major diseases that smoking can cause.     Even after the withdrawal is over, expect periodic urges to smoke. However, these cravings are generally short-lived and will go away whether you smoke or not. Do not smoke!     If you relapse and smoke again, do not lose hope. Most smokers quit 3 times before they are successful.     If you relapse, do not give up! Plan ahead and think about what you will do the next time you get the urge to smoke.  LIFE AS A NONSMOKER: MAKE IT FOR A MONTH, MAKE IT FOR LIFE Day 1: Hang this page where you will see it every day. Day 2: Get rid of all ashtrays, matches, and lighters. Day 3: Drink water.  Breathe deeply between sips. Day 4: Avoid places with smoke-filled air, such as bars, clubs, or the smoking section of restaurants. Day 5: Keep track of how much money you save by not smoking. Day 6: Avoid boredom. Keep a good book with you or go to the movies. Day 7: Reward yourself! One week without smoking! Day 8: Make a dental appointment to get your teeth cleaned. Day 9: Decide how you will turn  down a cigarette before it is offered to you. Day 10: Review your reasons for quitting. Day 11: Distract yourself. Stay active to keep your mind off smoking and to relieve tension. Take a walk, exercise, read a book, do a crossword puzzle, or try a new hobby. Day 12: Exercise. Get off the bus before your stop or use stairs instead of escalators. Day 13: Call on friends for support and encouragement. Day 14: Reward yourself! Two weeks without smoking! Day 15: Practice deep breathing exercises. Day 16: Bet a friend that you can stay a nonsmoker. Day 17: Ask to sit in nonsmoking sections of restaurants. Day 18: Hang up "No Smoking" signs. Day 19: Think of yourself as a nonsmoker. Day 20: Each morning, tell yourself you will not smoke. Day 21: Reward yourself! Three weeks without smoking! Day 22: Think of smoking in negative ways. Remember how it stains your teeth, gives you bad breath, and leaves you short of breath. Day 23: Eat a nutritious breakfast. Day 24:Do not relive your days as a smoker. Day 25: Hold a pencil in your hand when talking on the telephone. Day 26: Tell all your friends you do not smoke. Day 27: Think about how much better food tastes. Day 28: Remember, one cigarette is one too many. Day 29: Take up a hobby that will keep your hands busy. Day 30: Congratulations! One month without smoking! Give yourself a big reward. Your caregiver can direct you to community resources or hospitals for support, which may include:  Group support.     Education.    Hypnosis.     Subliminal therapy.  Document Released: 02/13/2004 Document Revised: 01/27/2011 Document Reviewed: 03/03/2009 [facility] Patient Information 2012 [place], [place].

## 2011-07-10 ENCOUNTER — Encounter: Payer: Self-pay | Admitting: Internal Medicine

## 2011-07-10 DIAGNOSIS — I2510 Atherosclerotic heart disease of native coronary artery without angina pectoris: Secondary | ICD-10-CM | POA: Insufficient documentation

## 2011-07-10 NOTE — Assessment & Plan Note (Addendum)
Last A1C 11.6%!! She had not been taking medications.  Plan - metformin 1000 mg bid.           Diet and exercise strongly recommended.            Follow-up A1C in 3  Months

## 2011-07-10 NOTE — Assessment & Plan Note (Signed)
Long term smoker who is now contemplative in regard to smoking cessation. Counseled on smoking cessation strategy including smoker's diary leading to behavioral change strategies. She will return when ready to set quit date. Will determine at that time if medical therapy will be of assistance in her cessation effort.

## 2011-07-10 NOTE — Assessment & Plan Note (Signed)
Lab Results  Component Value Date   CHOL 203* 07/03/2011   HDL 25* 07/03/2011   LDLCALC 135* 07/03/2011   TRIG 214* 07/03/2011   CHOLHDL 8.1 07/03/2011   Stressed the importance of secondary prevention with a target LDL of 80 or less. She will continue crestor.  Plan - follow-up lab in one month

## 2011-07-10 NOTE — Assessment & Plan Note (Signed)
Patient with non-stemi MI. Multiple risk factors. S/p Drug Eluting Stenting of Cx 95% - 0%. She is pain free.  Plan - risk factor modification.

## 2011-07-13 ENCOUNTER — Telehealth: Payer: Self-pay

## 2011-07-13 NOTE — Telephone Encounter (Signed)
Patient called c/o nausea and diarrhea since starting metformin BID on 07/10/11. Patient describes it as severe and "cant even smell food w/o getting nauseated. Spoke with MD who advised that he would like to hold out but if severe enough can change to Januvia 100 mg QD

## 2011-07-23 ENCOUNTER — Ambulatory Visit (INDEPENDENT_AMBULATORY_CARE_PROVIDER_SITE_OTHER): Payer: 59 | Admitting: Physician Assistant

## 2011-07-23 ENCOUNTER — Other Ambulatory Visit: Payer: Self-pay | Admitting: *Deleted

## 2011-07-23 ENCOUNTER — Encounter: Payer: Self-pay | Admitting: Physician Assistant

## 2011-07-23 VITALS — BP 94/66 | HR 79 | Ht 63.0 in | Wt 170.0 lb

## 2011-07-23 DIAGNOSIS — E875 Hyperkalemia: Secondary | ICD-10-CM

## 2011-07-23 DIAGNOSIS — Z720 Tobacco use: Secondary | ICD-10-CM

## 2011-07-23 DIAGNOSIS — E876 Hypokalemia: Secondary | ICD-10-CM

## 2011-07-23 DIAGNOSIS — E782 Mixed hyperlipidemia: Secondary | ICD-10-CM

## 2011-07-23 DIAGNOSIS — J40 Bronchitis, not specified as acute or chronic: Secondary | ICD-10-CM

## 2011-07-23 DIAGNOSIS — I10 Essential (primary) hypertension: Secondary | ICD-10-CM

## 2011-07-23 DIAGNOSIS — F17200 Nicotine dependence, unspecified, uncomplicated: Secondary | ICD-10-CM

## 2011-07-23 DIAGNOSIS — I2510 Atherosclerotic heart disease of native coronary artery without angina pectoris: Secondary | ICD-10-CM

## 2011-07-23 LAB — BASIC METABOLIC PANEL
Calcium: 9.2 mg/dL (ref 8.4–10.5)
GFR: 24.66 mL/min — ABNORMAL LOW (ref >60.00)
Sodium: 134 mEq/L — ABNORMAL LOW (ref 135–145)

## 2011-07-23 MED ORDER — LISINOPRIL 10 MG PO TABS: 5.0000 mg | ORAL_TABLET | Freq: Every day | ORAL | Status: DC

## 2011-07-23 MED ORDER — ALBUTEROL SULFATE HFA 108 (90 BASE) MCG/ACT IN AERS: 2.0000 | INHALATION_SPRAY | Freq: Four times a day (QID) | RESPIRATORY_TRACT | Status: DC | PRN

## 2011-07-23 MED ORDER — CARVEDILOL 6.25 MG PO TABS: 3.1250 mg | ORAL_TABLET | Freq: Two times a day (BID) | ORAL | Status: DC

## 2011-07-23 NOTE — Assessment & Plan Note (Signed)
Managed by PCP

## 2011-07-23 NOTE — Assessment & Plan Note (Signed)
She is trying to quit. 

## 2011-07-23 NOTE — Assessment & Plan Note (Signed)
I gave her a Rx for Albuterol PRN.  She has been advised to follow up with her PCP if she feels worse, develops a productive cough or has fevers.

## 2011-07-23 NOTE — Progress Notes (Signed)
[address] [place], [place]  [address] Phone: [phone] Fax:  [phone]  Date:  07/23/2011   Name:  Monica Michael       DOB:  01-25-1961 MRN:  [id number]  PCP:  Dr. Debby Bud  Primary Cardiologist:  Dr. Zackery Barefoot  Primary Electrophysiologist:  None    History of Present Illness: Monica Michael is a 51 y.o. female who returns for post hospital follow up.  She has a history of diabetes, hyper lipidemia and tobacco abuse.  Diabetes and hyperlipidemia largely been untreated of late.  She was admitted 2/1-2/4 with chest pain.  She ruled in for an NSTEMI.  LHC 07/03/11: Proximal LAD 50-70%, circumflex 95% mid, ostial RCA 50% EF 60%.  PCI: Resolute DES to the mid circumflex.  Other CAD was treated medically.  The notes indicate consideration could be given for a nuclear study to evaluate her LAD disease in the future.  Labs: Hgb 14.6, potassium 3.6, creatinine 0.58, ALT 19, TC 203, HDL 25, LDL 135, TG 214, A1c 11.6, TSH 1.651.  Chest x-ray was unremarkable.  Overall, doing well.  She has some cold symptoms with a cough.  It is non-productive.  No fever.  No hemoptysis.  The patient denies chest pain, shortness of breath, syncope, orthopnea, PND or significant pedal edema.  She is not interested in cardiac rehab as it is too expensive.  She has seen Dr. Debby Bud in follow up who is adjusting her meds for diabetes.  She is trying to quit smoking.    Past Medical History  Diagnosis Date  . Hyperlipidemia   . Psoriasis   . Paget's disease of bone     Noted on chest CT scan  . Type II or unspecified type diabetes mellitus without mention of complication, uncontrolled   . Other disorders of bone and cartilage   . CAD in native artery Jan '13    NSTEMI 07/2011: LHC 07/03/11: Proximal LAD 50-70%, circumflex 95% mid, ostial RCA 50% EF 60%.  PCI: Resolute DES to the mid circumflex  . HTN (hypertension)     Current Outpatient Prescriptions  Medication Sig Dispense Refill  .  aspirin 81 MG EC tablet Take 1 tablet (81 mg total) by mouth daily.  30 tablet    . carvedilol (COREG) 6.25 MG tablet Take 1 tablet (6.25 mg total) by mouth 2 (two) times daily with a meal.  60 tablet  6  . lisinopril (PRINIVIL,ZESTRIL) 10 MG tablet Take 1 tablet (10 mg total) by mouth daily.  30 tablet  6  . nitroGLYCERIN (NITROSTAT) 0.4 MG SL tablet Place 1 tablet (0.4 mg total) under the tongue every 5 (five) minutes x 3 doses as needed for chest pain (up to 3 doses).  25 tablet  3  . rosuvastatin (CRESTOR) 40 MG tablet Take 1 tablet (40 mg total) by mouth daily.  30 tablet  6  . sitaGLIPtin (JANUVIA) 100 MG tablet Take 100 mg by mouth daily.      . Ticagrelor (BRILINTA) 90 MG TABS tablet Take 1 tablet (90 mg total) by mouth 2 (two) times daily.  60 tablet  6    Allergies: No Known Allergies  History  Substance Use Topics  . Smoking status: Current Everyday Smoker    Types: Cigarettes  . Smokeless tobacco: Never Used   Comment: discussed cessation strategy  . Alcohol Use: No     ROS:  Please see the history of present illness.   She  feels lightheaded at times and thinks it is related to her lower BP.  All other systems reviewed and negative.   PHYSICAL EXAM: VS:  BP 94/66  Pulse 79  Ht 5\' 3"  (1.6 m)  Wt 170 lb (77.111 kg)  BMI 30.11 kg/m2 Well nourished, well developed, in no acute distress HEENT: normal Neck: no JVD Cardiac:  normal S1, S2; RRR; no murmur Lungs:  insp wheezing bilat, no rales Abd: soft, nontender, no hepatomegaly Ext: no edema; right wrist without hematoma or bruit Skin: warm and dry Neuro:  CNs 2-12 intact, no focal abnormalities noted  EKG:  Sinus rhythm, heart rate 72, normal axis, no acute changes  ASSESSMENT AND PLAN:

## 2011-07-23 NOTE — Patient Instructions (Signed)
Your physician recommends that you schedule a follow-up appointment in: 6 weeks with Dr Myrtis Ser Your physician recommends that you have lab work today (BMP) Your physician has recommended you make the following change in your medication: DECREASE Carvedilol to 6.25 mg 1/2 tab twice daily and Lisinopril 10 mg 1/2 tab daily START Albuterol inhaler as needed If your cough and fever gets worse please see you primary care physicians

## 2011-07-23 NOTE — Assessment & Plan Note (Signed)
BP running low and she is symptomatic.  Decrease coreg to 3.125 mg bid and Lisinorpril to 5 mg QD.  Check a BMET today.

## 2011-07-23 NOTE — Assessment & Plan Note (Signed)
Doing well.  No angina.  She will continue on aspirin and Brilinta.  We discussed the importance of dual antiplatelet therapy.  She cannot do cardiac rehabilitation due to the cost.  There was a question whether or not she would need a stress test because of her LAD disease.  I reviewed her films with Dr. Verne Carrow.   She is doing well and we will not pursue any further testing at this time.  At this point, she will continue her current therapy and followup with Dr. Myrtis Ser.  Followup in 6 weeks.

## 2011-07-26 ENCOUNTER — Other Ambulatory Visit (INDEPENDENT_AMBULATORY_CARE_PROVIDER_SITE_OTHER): Payer: 59

## 2011-07-26 ENCOUNTER — Other Ambulatory Visit: Payer: Self-pay | Admitting: *Deleted

## 2011-07-26 DIAGNOSIS — E782 Mixed hyperlipidemia: Secondary | ICD-10-CM

## 2011-07-26 DIAGNOSIS — E876 Hypokalemia: Secondary | ICD-10-CM

## 2011-07-26 DIAGNOSIS — N289 Disorder of kidney and ureter, unspecified: Secondary | ICD-10-CM

## 2011-07-26 LAB — HEPATIC FUNCTION PANEL
ALT: 19 U/L (ref 0–35)
AST: 43 U/L — ABNORMAL HIGH (ref 0–37)
Albumin: 3.4 g/dL — ABNORMAL LOW (ref 3.5–5.2)

## 2011-07-26 LAB — BASIC METABOLIC PANEL
Calcium: 8.4 mg/dL (ref 8.4–10.5)
Creatinine, Ser: 2 mg/dL — ABNORMAL HIGH (ref 0.4–1.2)
GFR: 27.66 mL/min — ABNORMAL LOW (ref >60.00)
Sodium: 132 mEq/L — ABNORMAL LOW (ref 135–145)

## 2011-07-26 LAB — LIPID PANEL
Cholesterol: 83 mg/dL (ref 0–200)
HDL: 14.8 mg/dL — ABNORMAL LOW (ref >39.00)
Triglycerides: 165 mg/dL — ABNORMAL HIGH (ref 0.0–149.0)

## 2011-07-28 ENCOUNTER — Other Ambulatory Visit: Payer: Self-pay | Admitting: *Deleted

## 2011-07-28 ENCOUNTER — Other Ambulatory Visit (INDEPENDENT_AMBULATORY_CARE_PROVIDER_SITE_OTHER): Payer: 59

## 2011-07-28 DIAGNOSIS — N289 Disorder of kidney and ureter, unspecified: Secondary | ICD-10-CM

## 2011-07-28 DIAGNOSIS — R899 Unspecified abnormal finding in specimens from other organs, systems and tissues: Secondary | ICD-10-CM

## 2011-07-28 LAB — BASIC METABOLIC PANEL
Chloride: 99 mEq/L (ref 96–112)
Creatinine, Ser: 1.5 mg/dL — ABNORMAL HIGH (ref 0.4–1.2)
GFR: 37.9 mL/min — ABNORMAL LOW (ref >60.00)
Potassium: 2.9 mEq/L — ABNORMAL LOW (ref 3.5–5.1)

## 2011-07-28 MED ORDER — POTASSIUM CHLORIDE ER 10 MEQ PO TBCR: 10.0000 meq | EXTENDED_RELEASE_TABLET | ORAL | Status: DC

## 2011-07-30 ENCOUNTER — Telehealth: Payer: Self-pay | Admitting: *Deleted

## 2011-07-30 ENCOUNTER — Other Ambulatory Visit (INDEPENDENT_AMBULATORY_CARE_PROVIDER_SITE_OTHER): Payer: 59

## 2011-07-30 DIAGNOSIS — I10 Essential (primary) hypertension: Secondary | ICD-10-CM

## 2011-07-30 DIAGNOSIS — R6889 Other general symptoms and signs: Secondary | ICD-10-CM

## 2011-07-30 DIAGNOSIS — N289 Disorder of kidney and ureter, unspecified: Secondary | ICD-10-CM

## 2011-07-30 LAB — BASIC METABOLIC PANEL
CO2: 29 mEq/L (ref 19–32)
Calcium: 8.2 mg/dL — ABNORMAL LOW (ref 8.4–10.5)
Chloride: 101 mEq/L (ref 96–112)
Sodium: 137 mEq/L (ref 135–145)

## 2011-07-30 LAB — MAGNESIUM: Magnesium: 2.5 mg/dL (ref 1.5–2.5)

## 2011-07-30 NOTE — Telephone Encounter (Signed)
pt notified of lab results today and to start K+ 10 meq daily, new rx sent in for # 30, bmet 3/6, pt gave verbal understanding. Danielle Rankin

## 2011-07-30 NOTE — Telephone Encounter (Signed)
Message copied by Tarri Fuller on Fri Jul 30, 2011  2:37 PM ------      Message from: [place], [place] T      Created: Fri Jul 30, 2011  1:18 PM       Renal function slowly getting better      K+ remains low      Magnesium looks good.      Have her start taking K+ 10 mEq daily until repeat labs      Repeat BMET next week (Wed 08/04/11)      Continue to push fluids.      Tereso Newcomer, PA-C  1:17 PM 07/30/2011

## 2011-08-04 ENCOUNTER — Other Ambulatory Visit (INDEPENDENT_AMBULATORY_CARE_PROVIDER_SITE_OTHER): Payer: 59

## 2011-08-04 DIAGNOSIS — I10 Essential (primary) hypertension: Secondary | ICD-10-CM

## 2011-08-04 LAB — BASIC METABOLIC PANEL
BUN: 13 mg/dL (ref 6–23)
Chloride: 100 mEq/L (ref 96–112)
Potassium: 3.6 mEq/L (ref 3.5–5.1)
Sodium: 136 mEq/L (ref 135–145)

## 2011-08-09 ENCOUNTER — Other Ambulatory Visit: Payer: Self-pay | Admitting: *Deleted

## 2011-08-09 ENCOUNTER — Encounter: Payer: Self-pay | Admitting: Internal Medicine

## 2011-08-09 ENCOUNTER — Other Ambulatory Visit: Payer: Self-pay | Admitting: Internal Medicine

## 2011-08-09 ENCOUNTER — Ambulatory Visit (INDEPENDENT_AMBULATORY_CARE_PROVIDER_SITE_OTHER): Payer: 59 | Admitting: Internal Medicine

## 2011-08-09 VITALS — BP 110/70 | HR 75 | Temp 97.8°F | Resp 16 | Wt 168.5 lb

## 2011-08-09 DIAGNOSIS — Z1231 Encounter for screening mammogram for malignant neoplasm of breast: Secondary | ICD-10-CM

## 2011-08-09 DIAGNOSIS — E782 Mixed hyperlipidemia: Secondary | ICD-10-CM

## 2011-08-09 DIAGNOSIS — F17200 Nicotine dependence, unspecified, uncomplicated: Secondary | ICD-10-CM

## 2011-08-09 DIAGNOSIS — IMO0001 Reserved for inherently not codable concepts without codable children: Secondary | ICD-10-CM

## 2011-08-09 DIAGNOSIS — Z1239 Encounter for other screening for malignant neoplasm of breast: Secondary | ICD-10-CM

## 2011-08-09 DIAGNOSIS — I10 Essential (primary) hypertension: Secondary | ICD-10-CM

## 2011-08-09 DIAGNOSIS — Z720 Tobacco use: Secondary | ICD-10-CM

## 2011-08-09 DIAGNOSIS — Z1211 Encounter for screening for malignant neoplasm of colon: Secondary | ICD-10-CM

## 2011-08-09 DIAGNOSIS — Z23 Encounter for immunization: Secondary | ICD-10-CM

## 2011-08-09 MED ORDER — GLIMEPIRIDE 2 MG PO TABS: 2.0000 mg | ORAL_TABLET | Freq: Every day | ORAL | Status: DC

## 2011-08-09 NOTE — Assessment & Plan Note (Signed)
BP Readings from Last 3 Encounters:  08/09/11 110/70  07/23/11 94/66  07/08/11 122/80   Excellent control on single agent.

## 2011-08-09 NOTE — Assessment & Plan Note (Signed)
Patient with drug intolerance.  Plan - continue life-style management           Trial of Amaryl 2 mg daily           CBG once or twice a week - call for high values           A1C in 3 months

## 2011-08-09 NOTE — Progress Notes (Signed)
  Subjective:    Patient ID: Monica Michael, female    DOB: 19-Feb-1961, 50 y.o.   MRN: [id number]  HPI [name] presents for follow-up of diabetes. She was intolerant of metformin due to GI problems. She had acute renal insufficiency with [place]. She has not been on medication for a couple of weeks and her last Cr was 1.2. She has been working on a no sugar low carb diet.   She has seen cardiology and is stable at this point.  Risk modification - her last LDL was well below goal of 80. Blood pressure on coreg alone is excellent. She is working on smoking cessation with a quit date of April 3rd.  PMH, FamHx and SocHx reviewed for any changes and relevance.    Review of Systems System review is negative for any constitutional, cardiac, pulmonary, GI or neuro symptoms or complaints other than as described in the HPI.     Objective:   Physical Exam Filed Vitals:   08/09/11 1030  BP: 110/70  Pulse: 75  Temp: 97.8 F (36.6 C)  Resp: 16  Weight: 168 lb 8 oz (76.431 kg)  Gen'l WNWD white woman in no distress Cor - RRR PUlm - normal respirations Neuro - A&O x 3, CN II-XII normal       Assessment & Plan:

## 2011-08-09 NOTE — Assessment & Plan Note (Signed)
Getting ready to quit. She has identified many of her triggers. She is cutting back. She has set her quit date - April 3rd.

## 2011-08-09 NOTE — Assessment & Plan Note (Signed)
Very good control 

## 2011-08-24 ENCOUNTER — Encounter: Payer: Self-pay | Admitting: Internal Medicine

## 2011-09-01 ENCOUNTER — Encounter: Payer: Self-pay | Admitting: Cardiology

## 2011-09-02 ENCOUNTER — Ambulatory Visit (INDEPENDENT_AMBULATORY_CARE_PROVIDER_SITE_OTHER): Payer: 59 | Admitting: Cardiology

## 2011-09-02 ENCOUNTER — Encounter: Payer: Self-pay | Admitting: Cardiology

## 2011-09-02 VITALS — BP 123/70 | HR 82 | Resp 18 | Ht 63.0 in | Wt 167.8 lb

## 2011-09-02 DIAGNOSIS — E785 Hyperlipidemia, unspecified: Secondary | ICD-10-CM

## 2011-09-02 DIAGNOSIS — Z720 Tobacco use: Secondary | ICD-10-CM

## 2011-09-02 DIAGNOSIS — I2510 Atherosclerotic heart disease of native coronary artery without angina pectoris: Secondary | ICD-10-CM

## 2011-09-02 DIAGNOSIS — F17200 Nicotine dependence, unspecified, uncomplicated: Secondary | ICD-10-CM

## 2011-09-02 NOTE — Assessment & Plan Note (Signed)
Lipids are being treated. No change in therapy. 

## 2011-09-02 NOTE — Progress Notes (Signed)
HPI Patient is doing very well from the cardiac viewpoint. I met her at the emergency room  When she was first admitted with her first cardiac admission. She did well. She has been seen back in the office on one occasion already by [employer]. I have reviewed his notes carefully. She has not been having any chest pain. There was question if she should have a stress study after her intervention. This was reviewed further and it was felt that she does not need this. She is wearing nicotine patch and is not smoking at this time. She seems very motivated.  No Known Allergies  Current Outpatient Prescriptions  Medication Sig Dispense Refill  . albuterol (PROAIR HFA) 108 (90 BASE) MCG/ACT inhaler Inhale 2 puffs into the lungs every 6 (six) hours as needed for wheezing.  1 Inhaler  0  . aspirin 81 MG EC tablet Take 1 tablet (81 mg total) by mouth daily.  30 tablet    . carvedilol (COREG) 6.25 MG tablet Take 0.5 tablets (3.125 mg total) by mouth 2 (two) times daily with a meal.  60 tablet  6  . glimepiride (AMARYL) 2 MG tablet Take 1 tablet (2 mg total) by mouth daily before breakfast.  30 tablet  3  . nicotine (NICODERM CQ - DOSED IN MG/24 HOURS) 21 mg/24hr patch Place 1 patch onto the skin daily.      . nitroGLYCERIN (NITROSTAT) 0.4 MG SL tablet Place 1 tablet (0.4 mg total) under the tongue every 5 (five) minutes x 3 doses as needed for chest pain (up to 3 doses).  25 tablet  3  . rosuvastatin (CRESTOR) 40 MG tablet Take 1 tablet (40 mg total) by mouth daily.  30 tablet  6  . Ticagrelor (BRILINTA) 90 MG TABS tablet Take 1 tablet (90 mg total) by mouth 2 (two) times daily.  60 tablet  6  . DISCONTD: lisinopril (PRINIVIL,ZESTRIL) 10 MG tablet Take 0.5 tablets (5 mg total) by mouth daily.  30 tablet  6    History   Social History  . Marital Status: Widowed    Spouse Name: N/A    Number of Children: 1  . Years of Education: N/A   Occupational History  .  [employer]   Social  History Main Topics  . Smoking status: Former Smoker    Types: Cigarettes    Quit date: 08/31/2011  . Smokeless tobacco: Never Used   Comment: discussed cessation strategy  . Alcohol Use: No  . Drug Use: No  . Sexually Active: Not Currently   Other Topics Concern  . Not on file   Social History Narrative   [employer]. Married '84-14 yrs; divorced; married '06 - 5 yers/widowed. 1 son - '89. Work-quality reviewer in an office setting.    Family History  Problem Relation Age of Onset  . Heart failure Father 26    deceased  . Diabetes Father   . Kidney failure Father   . Hypertension Father   . Hyperlipidemia Father   . Coronary artery disease Father   . Hodgkin's lymphoma Mother 63    deceased  . Cancer Neg Hx     breast, colon    Past Medical History  Diagnosis Date  . Hyperlipidemia   . Ejection fraction     EF 60%, catheterization, February, 2013  . Paget's disease of bone     Noted on chest CT scan  . Type II or unspecified type diabetes mellitus without  mention of complication, uncontrolled   . Other disorders of bone and cartilage   . CAD in native artery Jan '13    NSTEMI 07/2011: LHC 07/03/11: Proximal LAD 50-70%, circumflex 95% mid, ostial RCA 50% EF 60%.  PCI: Resolute DES to the mid circumflex  . HTN (hypertension)   . Tobacco abuse   . Bronchitis     February, 2013    Past Surgical History  Procedure Date  . Abdominal hysterectomy     ROS Patient denies fever, chills, headache, sweats, rash, change in vision, change in hearing, chest pain, cough, nausea vomiting, urinary symptoms. All other systems are reviewed and are negative.  PHYSICAL EXAM Patient is oriented to person time and place. Affect is normal. There is no jugulovenous distention. There no carotid bruits. Lungs are clear. Respiratory effort is nonlabored. Cardiac exam reveals S1 and S2. There no clicks or significant murmurs. The abdomen is soft. There is no peripheral edema.  Filed Vitals:    09/02/11 0921  BP: 123/70  Pulse: 82  Resp: 18  Height: 5\' 3"  (1.6 m)  Weight: 167 lb 12.8 oz (76.114 kg)     ASSESSMENT & PLAN

## 2011-09-02 NOTE — Assessment & Plan Note (Signed)
Today in the office we both celebrated the fact that she is using the  Nicotine patch and not smoking. Hopefully this will lead to complete cessation of her smoking.

## 2011-09-02 NOTE — Assessment & Plan Note (Signed)
Coronary disease is stable. She continues on dual antiplatelet therapy. I will not change her current meds. Over time we will see if  She can be changed to Plavix. I see her back in 3 months for followup.

## 2011-09-02 NOTE — Patient Instructions (Signed)
Your physician recommends that you schedule a follow-up appointment in: 3 months.  

## 2011-09-08 ENCOUNTER — Ambulatory Visit
Admission: RE | Admit: 2011-09-08 | Discharge: 2011-09-08 | Disposition: A | Discharge status: Home / Self Care | Payer: 59 | Source: Ambulatory Visit | Attending: Internal Medicine | Admitting: Internal Medicine

## 2011-09-08 DIAGNOSIS — Z1231 Encounter for screening mammogram for malignant neoplasm of breast: Secondary | ICD-10-CM

## 2011-09-20 ENCOUNTER — Telehealth: Payer: Self-pay | Admitting: Cardiology

## 2011-09-20 ENCOUNTER — Ambulatory Visit (AMBULATORY_SURGERY_CENTER): Payer: 59 | Admitting: *Deleted

## 2011-09-20 VITALS — Ht 63.0 in | Wt 165.7 lb

## 2011-09-20 DIAGNOSIS — Z1211 Encounter for screening for malignant neoplasm of colon: Secondary | ICD-10-CM

## 2011-09-20 MED ORDER — PEG-KCL-NACL-NASULF-NA ASC-C 100 G PO SOLR: 1.0000 | Freq: Once | ORAL | Status: DC

## 2011-09-20 NOTE — Telephone Encounter (Signed)
She is due for a colonoscopy.  Her GI physician wants to know if she can discontinue her Brilinta 5 days prior to the colonoscopy.  No date has been set for the colonoscopy yet.

## 2011-09-20 NOTE — Telephone Encounter (Signed)
ASA and Brilinta  CAN NOT, NOT, NOT  Be stopped. Colonoscopy will have to be done on meds.

## 2011-09-20 NOTE — Telephone Encounter (Signed)
N/A.  LMTC. 

## 2011-09-20 NOTE — Progress Notes (Signed)
Patient here for previsit for screening colonoscopy. History of cardiac catheterization and stent placement 07/2011. Discussed scheduling an office visit with Lavonna Rua who states that patient needs to check in with cardiologist to determine when she may be able to come off of her blood thinner and at that time schedule an office visit with Dr. Leone Payor and reschedule colonoscopy. Pt informed and she understands. She will check in with cardiologist and schedule office visit closer to time when she can discontinue blood thinning medications for procedure.

## 2011-09-20 NOTE — Telephone Encounter (Signed)
New msg Pt wants to talk to you about colonoscopy she needs to be scheduled. She was concerned about brilinta. She needs to be off for five days. Please call

## 2011-09-20 NOTE — Telephone Encounter (Signed)
Pt was notified.  

## 2011-09-27 ENCOUNTER — Telehealth: Payer: Self-pay | Admitting: Internal Medicine

## 2011-09-27 NOTE — Telephone Encounter (Signed)
Message copied by Jacques Navy on Mon Sep 27, 2011  9:07 AM ------      Message from: Illene Regulus E      Created: Mon Aug 09, 2011 11:45 AM       Call April 4th about smoking cessation

## 2011-09-27 NOTE — Telephone Encounter (Signed)
Called re: smoking cessation - left msg on cell

## 2011-09-30 ENCOUNTER — Telehealth: Payer: Self-pay | Admitting: Cardiology

## 2011-09-30 NOTE — Telephone Encounter (Signed)
New msg Pt called and wanted to know if she needed antibiotics before having dental work done.

## 2011-09-30 NOTE — Telephone Encounter (Signed)
Patient called no answer.Left message on personal voice mail will check with Dr.Katz to see if she will need antibiotic before dental work,he is not in office today.

## 2011-10-01 NOTE — Telephone Encounter (Signed)
The patient does not need antibiotics before dental work.

## 2011-10-01 NOTE — Telephone Encounter (Signed)
N/A.  LMTC. 

## 2011-10-04 ENCOUNTER — Other Ambulatory Visit: Payer: 59 | Admitting: Internal Medicine

## 2011-10-04 NOTE — Telephone Encounter (Signed)
Pt was notified that she does not need antibiotics per Dr Myrtis Ser.

## 2011-10-04 NOTE — Telephone Encounter (Signed)
Follow-up:    Patient returned your call.  Please call back. 

## 2011-10-04 NOTE — Telephone Encounter (Signed)
N/A.  LMTC. 

## 2011-10-06 ENCOUNTER — Other Ambulatory Visit: Payer: Self-pay | Admitting: Cardiology

## 2011-10-11 ENCOUNTER — Other Ambulatory Visit: Payer: Self-pay | Admitting: Cardiology

## 2011-10-11 MED ORDER — TICAGRELOR 90 MG PO TABS: 90.0000 mg | ORAL_TABLET | Freq: Two times a day (BID) | ORAL | Status: DC

## 2011-10-12 ENCOUNTER — Other Ambulatory Visit: Payer: Self-pay | Admitting: *Deleted

## 2011-10-12 MED ORDER — ROSUVASTATIN CALCIUM 40 MG PO TABS: 40.0000 mg | ORAL_TABLET | Freq: Every day | ORAL | Status: DC

## 2011-10-12 NOTE — Telephone Encounter (Signed)
Fax Received. Refill Completed. Cyd Hostler Chowoe (R.M.A)   

## 2011-12-06 ENCOUNTER — Encounter: Payer: Self-pay | Admitting: Cardiology

## 2011-12-06 ENCOUNTER — Ambulatory Visit (INDEPENDENT_AMBULATORY_CARE_PROVIDER_SITE_OTHER): Payer: 59 | Admitting: Cardiology

## 2011-12-06 VITALS — BP 100/60 | HR 80 | Ht 63.0 in | Wt 163.0 lb

## 2011-12-06 DIAGNOSIS — F17200 Nicotine dependence, unspecified, uncomplicated: Secondary | ICD-10-CM

## 2011-12-06 DIAGNOSIS — I2510 Atherosclerotic heart disease of native coronary artery without angina pectoris: Secondary | ICD-10-CM

## 2011-12-06 DIAGNOSIS — Z720 Tobacco use: Secondary | ICD-10-CM

## 2011-12-06 MED ORDER — CLOPIDOGREL BISULFATE 75 MG PO TABS: 75.0000 mg | ORAL_TABLET | Freq: Every day | ORAL | Status: DC

## 2011-12-06 NOTE — Assessment & Plan Note (Signed)
The patient has stopped smoking. I applauded her for this.

## 2011-12-06 NOTE — Patient Instructions (Addendum)
Your physician recommends that you schedule a follow-up appointment in: 3 months  Your physician has recommended you make the following change in your medication: Start Plavix 75mg  daily the morning after you run out of Brilinta

## 2011-12-06 NOTE — Assessment & Plan Note (Signed)
Coronary disease is stable. No change in therapy. It is safe to change her Brilenta to Plavix at this time.

## 2011-12-06 NOTE — Progress Notes (Signed)
HPI  Patient is doing very well. She had a coronary intervention in February, 2013. She had one day recently where she felt a little unusual and took a nitroglycerin. Retrospectively we do not think that she had angina. She's doing well overall. There had been consideration for doing a colonoscopy. I made it clear that her dual antiplatelet therapy should not be held electively.  No Known Allergies  Current Outpatient Prescriptions  Medication Sig Dispense Refill  . acetaminophen (TYLENOL) 500 MG tablet Take 1,000 mg by mouth every 6 (six) hours as needed.      Marland Kitchen albuterol (PROAIR HFA) 108 (90 BASE) MCG/ACT inhaler Inhale 2 puffs into the lungs every 6 (six) hours as needed for wheezing.  1 Inhaler  0  . aspirin 81 MG EC tablet Take 1 tablet (81 mg total) by mouth daily.  30 tablet    . carvedilol (COREG) 6.25 MG tablet Take 0.5 tablets (3.125 mg total) by mouth 2 (two) times daily with a meal.  60 tablet  6  . glimepiride (AMARYL) 2 MG tablet Take 1 tablet (2 mg total) by mouth daily before breakfast.  30 tablet  3  . nitroGLYCERIN (NITROSTAT) 0.4 MG SL tablet Place 1 tablet (0.4 mg total) under the tongue every 5 (five) minutes x 3 doses as needed for chest pain (up to 3 doses).  25 tablet  3  . rosuvastatin (CRESTOR) 40 MG tablet Take 1 tablet (40 mg total) by mouth daily.  90 tablet  3  . Ticagrelor (BRILINTA) 90 MG TABS tablet Take 1 tablet (90 mg total) by mouth 2 (two) times daily.  180 tablet  1  . DISCONTD: lisinopril (PRINIVIL,ZESTRIL) 10 MG tablet Take 0.5 tablets (5 mg total) by mouth daily.  30 tablet  6    History   Social History  . Marital Status: Widowed    Spouse Name: N/A    Number of Children: 1  . Years of Education: N/A   Occupational History  .  [employer]   Social History Main Topics  . Smoking status: Former Smoker -- 0.5 packs/day    Types: Cigarettes  . Smokeless tobacco: Never Used   Comment: discussed cessation strategy  . Alcohol Use: No    . Drug Use: No  . Sexually Active: Not Currently   Other Topics Concern  . Not on file   Social History Narrative   [employer]. Married '84-14 yrs; divorced; married '06 - 5 yers/widowed. 1 son - '89. Work-quality reviewer in an office setting.    Family History  Problem Relation Age of Onset  . Heart failure Father 63    deceased  . Diabetes Father   . Kidney failure Father   . Hypertension Father   . Hyperlipidemia Father   . Coronary artery disease Father   . Hodgkin's lymphoma Mother 50    deceased  . Cancer Neg Hx     breast, colon  . Colon cancer Neg Hx   . Rectal cancer Neg Hx   . Stomach cancer Neg Hx   . Esophageal cancer Neg Hx     Past Medical History  Diagnosis Date  . Hyperlipidemia   . Ejection fraction     EF 60%, catheterization, February, 2013  . Paget's disease of bone     Noted on chest CT scan  . Type II or unspecified type diabetes mellitus without mention of complication, uncontrolled   . Other disorders of bone and cartilage   .  CAD in native artery Jan '13    NSTEMI 07/2011: LHC 07/03/11: Proximal LAD 50-70%, circumflex 95% mid, ostial RCA 50% EF 60%.  PCI: Resolute DES to the mid circumflex  . HTN (hypertension)   . Tobacco abuse   . Bronchitis     February, 2013    Past Surgical History  Procedure Date  . Abdominal hysterectomy   . Coronary angioplasty with stent placement     ROS   Patient denies fever, chills, headache, sweats, rash, change in vision, change in hearing, chest pain, cough, nausea vomiting, urinary symptoms. All other systems are reviewed and are negative.  PHYSICAL EXAM   Patient is doing very well. She has stopped smoking. She is oriented to person time and place. Affect is normal. There is no jugulovenous distention. Lungs are clear. Respiratory effort is nonlabored. Cardiac exam reveals S1 and S2. There no clicks or significant murmurs. The abdomen is soft. There's no peripheral edema.  Filed Vitals:   12/06/11 0930   BP: 100/60  Pulse: 80  Height: 5\' 3"  (1.6 m)  Weight: 163 lb (73.936 kg)     ASSESSMENT & PLAN

## 2011-12-09 ENCOUNTER — Other Ambulatory Visit: Payer: Self-pay | Admitting: *Deleted

## 2011-12-09 MED ORDER — GLIMEPIRIDE 2 MG PO TABS: 2.0000 mg | ORAL_TABLET | Freq: Every day | ORAL | Status: DC

## 2011-12-09 NOTE — Telephone Encounter (Signed)
Refill on glimepiride    [facility]

## 2011-12-13 ENCOUNTER — Other Ambulatory Visit: Payer: Self-pay

## 2011-12-13 MED ORDER — GLIMEPIRIDE 2 MG PO TABS: 2.0000 mg | ORAL_TABLET | Freq: Every day | ORAL | Status: DC

## 2011-12-13 NOTE — Telephone Encounter (Signed)
Rx sent to mail order pharmacy instead of local. Rx resent to [facility], pt informed.

## 2011-12-17 ENCOUNTER — Telehealth: Payer: Self-pay | Admitting: Cardiology

## 2011-12-17 NOTE — Telephone Encounter (Signed)
I called pt to make sure that she is not taking esomeprazole, omeprazole or cimetidine as her pharmacy is concerned about a drug interaction.  She states she is not currently taking any of these drugs.  I did make her aware of the drug interaction between these and the Plavix in case she wants to take them in the future.

## 2011-12-17 NOTE — Telephone Encounter (Signed)
Fu call °Pt returning your call  °

## 2012-02-15 ENCOUNTER — Telehealth: Payer: Self-pay | Admitting: Internal Medicine

## 2012-02-15 ENCOUNTER — Other Ambulatory Visit (INDEPENDENT_AMBULATORY_CARE_PROVIDER_SITE_OTHER): Payer: 59

## 2012-02-15 ENCOUNTER — Encounter: Payer: Self-pay | Admitting: Internal Medicine

## 2012-02-15 ENCOUNTER — Ambulatory Visit (INDEPENDENT_AMBULATORY_CARE_PROVIDER_SITE_OTHER): Payer: 59 | Admitting: Internal Medicine

## 2012-02-15 VITALS — BP 100/62 | HR 77 | Temp 98.2°F | Resp 16 | Wt 166.0 lb

## 2012-02-15 DIAGNOSIS — L408 Other psoriasis: Secondary | ICD-10-CM

## 2012-02-15 DIAGNOSIS — M79609 Pain in unspecified limb: Secondary | ICD-10-CM

## 2012-02-15 DIAGNOSIS — E782 Mixed hyperlipidemia: Secondary | ICD-10-CM

## 2012-02-15 DIAGNOSIS — I2510 Atherosclerotic heart disease of native coronary artery without angina pectoris: Secondary | ICD-10-CM

## 2012-02-15 DIAGNOSIS — M79672 Pain in left foot: Secondary | ICD-10-CM

## 2012-02-15 DIAGNOSIS — Z720 Tobacco use: Secondary | ICD-10-CM

## 2012-02-15 DIAGNOSIS — IMO0001 Reserved for inherently not codable concepts without codable children: Secondary | ICD-10-CM

## 2012-02-15 DIAGNOSIS — F17200 Nicotine dependence, unspecified, uncomplicated: Secondary | ICD-10-CM

## 2012-02-15 DIAGNOSIS — I10 Essential (primary) hypertension: Secondary | ICD-10-CM

## 2012-02-15 LAB — HEMOGLOBIN A1C: Hgb A1c MFr Bld: 7.4 % — ABNORMAL HIGH (ref 4.6–6.5)

## 2012-02-15 NOTE — Telephone Encounter (Signed)
Psoriasis

## 2012-02-15 NOTE — Telephone Encounter (Signed)
Ok for [employer]. May be worked in [employer] or [employer]

## 2012-02-15 NOTE — Telephone Encounter (Signed)
Caller: [name]/Patient; Patient Name: Monica Michael; PCP: Illene Regulus (Adults only); Best Callback Phone Number: [phone]; Reason for call: Other. 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  Pt states that she has had pain for over a week.  Pt reports it is red and  sometimes warm to touch.  Pt is afebrile.  Pt states she has several open areas on her foot due to the cirrhosis and althought they are better, they are not going away.  Triaged patient per Diabetes:  Foot Problems.  See Provider Within 24 hours Disposition for 'Open area on soft tissue or over any pressure point not previously evaluated without signs of infection'.  Home care advice given and appt scheduled for 4:30 on 02/15/12 with Dr Debby Bud. (patient is requesting some lab work to be done; RN instructed pt to speak with provider about labs)

## 2012-02-16 LAB — COMPREHENSIVE METABOLIC PANEL
Albumin: 3.7 g/dL (ref 3.5–5.2)
Alkaline Phosphatase: 101 U/L (ref 39–117)
BUN: 15 mg/dL (ref 6–23)
Creatinine, Ser: 0.8 mg/dL (ref 0.4–1.2)
Glucose, Bld: 87 mg/dL (ref 70–99)
Total Bilirubin: 0.3 mg/dL (ref 0.3–1.2)

## 2012-02-16 LAB — HEPATIC FUNCTION PANEL
AST: 20 U/L (ref 0–37)
Alkaline Phosphatase: 101 U/L (ref 39–117)
Bilirubin, Direct: 0.1 mg/dL (ref 0.0–0.3)
Total Bilirubin: 0.3 mg/dL (ref 0.3–1.2)

## 2012-02-16 LAB — LIPID PANEL
Cholesterol: 98 mg/dL (ref 0–200)
HDL: 27.2 mg/dL — ABNORMAL LOW (ref >39.00)
Triglycerides: 265 mg/dL — ABNORMAL HIGH (ref 0.0–149.0)
VLDL: 53 mg/dL — ABNORMAL HIGH (ref 0.0–40.0)

## 2012-02-16 LAB — TSH: TSH: 1.39 u[IU]/mL (ref 0.35–5.50)

## 2012-02-16 NOTE — Telephone Encounter (Signed)
PT had an appt on Sept 17.

## 2012-02-19 NOTE — Assessment & Plan Note (Signed)
Stable with no report of chest pain.  Plan Continue aggressive risk factor modification.

## 2012-02-19 NOTE — Assessment & Plan Note (Signed)
Has been on full range of treatments currently (Sept'13) prefers to not take any systemic medication. She has never had good results and feels adverse effects and risks outweigh benefit.

## 2012-02-19 NOTE — Assessment & Plan Note (Signed)
Due for follow-up lab with recommendations to follow.  Addendum: A1C just above goal of 7% or less and below threshold for medication change. Plan-  continue present medication  Better adherence to diet and exercise regimen

## 2012-02-19 NOTE — Progress Notes (Signed)
Subjective:    Patient ID: Monica Michael, female    DOB: Jan 03, 1961, 51 y.o.   MRN: [id number]  HPI [name] presents for evaluation of foot pain, left. She has long standing psoriasis of the feet. She had been doing a lot of walking and developed pain in the left fore foot. She had noticed a small laceration at the base of the left great toe and tenderness in the intra-articular spaces. She has had no fever or chills, no draining wound on the foot. She has purchased [employer] and is already noticing a difference in regard to her foot pain.  Past Medical History  Diagnosis Date  . Hyperlipidemia   . Ejection fraction     EF 60%, catheterization, February, 2013  . Paget's disease of bone     Noted on chest CT scan  . Type II or unspecified type diabetes mellitus without mention of complication, uncontrolled   . Other disorders of bone and cartilage   . CAD in native artery Jan '13    NSTEMI 07/2011: LHC 07/03/11: Proximal LAD 50-70%, circumflex 95% mid, ostial RCA 50% EF 60%.  PCI: Resolute DES to the mid circumflex  . HTN (hypertension)   . Tobacco abuse   . Bronchitis     February, 2013   Past Surgical History  Procedure Date  . Abdominal hysterectomy   . Coronary angioplasty with stent placement    Family History  Problem Relation Age of Onset  . Heart failure Father 69    deceased  . Diabetes Father   . Kidney failure Father   . Hypertension Father   . Hyperlipidemia Father   . Coronary artery disease Father   . Hodgkin's lymphoma Mother 30    deceased  . Cancer Neg Hx     breast, colon  . Colon cancer Neg Hx   . Rectal cancer Neg Hx   . Stomach cancer Neg Hx   . Esophageal cancer Neg Hx    History   Social History  . Marital Status: Widowed    Spouse Name: N/A    Number of Children: 1  . Years of Education: N/A   Occupational History  .  [employer]   Social History Main Topics  . Smoking status: Current Every Day Smoker -- 0.5 packs/day   Types: Cigarettes  . Smokeless tobacco: Never Used   Comment: discussed cessation strategy  . Alcohol Use: No  . Drug Use: No  . Sexually Active: Not Currently   Other Topics Concern  . Not on file   Social History Narrative   [employer]. Married '84-14 yrs; divorced; married '06 - 5 yers/widowed. 1 son - '89. Work-quality reviewer in an office setting.    Current Outpatient Prescriptions on File Prior to Visit  Medication Sig Dispense Refill  . acetaminophen (TYLENOL) 500 MG tablet Take 1,000 mg by mouth every 6 (six) hours as needed.      Marland Kitchen albuterol (PROAIR HFA) 108 (90 BASE) MCG/ACT inhaler Inhale 2 puffs into the lungs every 6 (six) hours as needed for wheezing.  1 Inhaler  0  . aspirin 81 MG EC tablet Take 1 tablet (81 mg total) by mouth daily.  30 tablet    . carvedilol (COREG) 6.25 MG tablet Take 0.5 tablets (3.125 mg total) by mouth 2 (two) times daily with a meal.  60 tablet  6  . clopidogrel (PLAVIX) 75 MG tablet Take 1 tablet (75 mg total) by mouth daily.  90  tablet  3  . glimepiride (AMARYL) 2 MG tablet Take 1 tablet (2 mg total) by mouth daily before breakfast.  30 tablet  6  . nitroGLYCERIN (NITROSTAT) 0.4 MG SL tablet Place 1 tablet (0.4 mg total) under the tongue every 5 (five) minutes x 3 doses as needed for chest pain (up to 3 doses).  25 tablet  3  . rosuvastatin (CRESTOR) 40 MG tablet Take 1 tablet (40 mg total) by mouth daily.  90 tablet  3  . DISCONTD: lisinopril (PRINIVIL,ZESTRIL) 10 MG tablet Take 0.5 tablets (5 mg total) by mouth daily.  30 tablet  6      Review of Systems System review is negative for any constitutional, cardiac, pulmonary, GI or neuro symptoms or complaints other than as described in the HPI.     Objective:   Physical Exam Filed Vitals:   02/15/12 1643  BP: 100/62  Pulse: 77  Temp: 98.2 F (36.8 C)  Resp: 16   Gen'l- WNWD white woman in no distress Cor- RRR Pulm - normal respirations Derm - chronic psoriatic changes left foot. No  open wound found on plantar foot MSK- good ROM ankle, toes, eversion and inversion. Minor tenderness at the metatarsal - talar joints, mild tenderness at the MTP joints       Assessment & Plan:  Foot pain, left - mild overuse strain. No evidence of fracture. May be manifestation of mild psoriatic arthritis foot.  Plan otc analgesic of choice  Agree with good shoes, i.e. [employer]

## 2012-02-19 NOTE — Assessment & Plan Note (Addendum)
BP Readings from Last 3 Encounters:  02/15/12 100/62  12/06/11 100/60  09/02/11 123/70   Excellent control of BP. Bmet normal

## 2012-02-19 NOTE — Assessment & Plan Note (Signed)
Advised to stop smoking.

## 2012-02-19 NOTE — Assessment & Plan Note (Signed)
For lab today with recommendations to follow.   Addendum : LDL is better than goal of 100 or less

## 2012-02-21 ENCOUNTER — Encounter: Payer: Self-pay | Admitting: Internal Medicine

## 2012-03-13 ENCOUNTER — Ambulatory Visit: Payer: 59 | Admitting: Cardiology

## 2012-04-25 ENCOUNTER — Encounter: Payer: Self-pay | Admitting: Cardiology

## 2012-04-25 ENCOUNTER — Ambulatory Visit (INDEPENDENT_AMBULATORY_CARE_PROVIDER_SITE_OTHER): Payer: 59 | Admitting: Cardiology

## 2012-04-25 VITALS — BP 100/64 | HR 84 | Resp 12 | Ht 63.0 in | Wt 164.4 lb

## 2012-04-25 DIAGNOSIS — F17200 Nicotine dependence, unspecified, uncomplicated: Secondary | ICD-10-CM

## 2012-04-25 DIAGNOSIS — I2510 Atherosclerotic heart disease of native coronary artery without angina pectoris: Secondary | ICD-10-CM

## 2012-04-25 DIAGNOSIS — E782 Mixed hyperlipidemia: Secondary | ICD-10-CM

## 2012-04-25 DIAGNOSIS — Z720 Tobacco use: Secondary | ICD-10-CM

## 2012-04-25 NOTE — Assessment & Plan Note (Addendum)
Coronary disease is stable. She had a non-STEMI with a DES placed January, 2013. With this in mind I will continue Plavix longer than one year. She also has significant LAD disease.  The patient has known coronary disease. She also has hyperlipidemia, hypertension, diabetes. We will proceed with a carotid Doppler to be sure that she does not have occult severe carotid disease.

## 2012-04-25 NOTE — Assessment & Plan Note (Signed)
Lipids are being treated. Her most recent LDL was 49. Her goal for LDL should be below 80.

## 2012-04-25 NOTE — Assessment & Plan Note (Signed)
The patient is still smoking. Once again we discussed this and she knows to do everything she can to stop.

## 2012-04-25 NOTE — Patient Instructions (Addendum)
Your physician wants you to follow-up in: 6 months.   You will receive a reminder letter in the mail two months in advance. If you don't receive a letter, please call our office to schedule the follow-up appointment.  Your physician recommends that you continue on your current medications as directed. Please refer to the Current Medication list given to you today.  Your physician has requested that you have a carotid duplex. This test is an ultrasound of the carotid arteries in your neck. It looks at blood flow through these arteries that supply the brain with blood. Allow one hour for this exam. There are no restrictions or special instructions.

## 2012-04-25 NOTE — Progress Notes (Signed)
HPI  The patient is seen to followup coronary disease. She had an intervention with a non-STEMI in February, 2013. Initially she was on Brilinta, I then changed her to Plavix. She's doing well. She is not having any chest pain. She is following recommendations other than the fact that unfortunately she continues to smoke. She understands fully that this is an issue and she is doing her best.  No Known Allergies  Current Outpatient Prescriptions  Medication Sig Dispense Refill  . acetaminophen (TYLENOL) 500 MG tablet Take 1,000 mg by mouth every 6 (six) hours as needed.      Marland Kitchen albuterol (PROAIR HFA) 108 (90 BASE) MCG/ACT inhaler Inhale 2 puffs into the lungs every 6 (six) hours as needed for wheezing.  1 Inhaler  0  . aspirin 81 MG EC tablet Take 1 tablet (81 mg total) by mouth daily.  30 tablet    . carvedilol (COREG) 6.25 MG tablet Take 0.5 tablets (3.125 mg total) by mouth 2 (two) times daily with a meal.  60 tablet  6  . clopidogrel (PLAVIX) 75 MG tablet Take 1 tablet (75 mg total) by mouth daily.  90 tablet  3  . glimepiride (AMARYL) 2 MG tablet Take 1 tablet (2 mg total) by mouth daily before breakfast.  30 tablet  6  . nitroGLYCERIN (NITROSTAT) 0.4 MG SL tablet Place 1 tablet (0.4 mg total) under the tongue every 5 (five) minutes x 3 doses as needed for chest pain (up to 3 doses).  25 tablet  3  . rosuvastatin (CRESTOR) 40 MG tablet Take 1 tablet (40 mg total) by mouth daily.  90 tablet  3  . [DISCONTINUED] lisinopril (PRINIVIL,ZESTRIL) 10 MG tablet Take 0.5 tablets (5 mg total) by mouth daily.  30 tablet  6    History   Social History  . Marital Status: Widowed    Spouse Name: N/A    Number of Children: 1  . Years of Education: N/A   Occupational History  .  [employer]   Social History Main Topics  . Smoking status: Current Every Day Smoker -- 0.5 packs/day    Types: Cigarettes  . Smokeless tobacco: Never Used     Comment: discussed cessation strategy  . Alcohol  Use: No  . Drug Use: No  . Sexually Active: Not Currently   Other Topics Concern  . Not on file   Social History Narrative   [employer]. Married '84-14 yrs; divorced; married '06 - 5 yers/widowed. 1 son - '89. Work-quality reviewer in an office setting.    Family History  Problem Relation Age of Onset  . Heart failure Father 68    deceased  . Diabetes Father   . Kidney failure Father   . Hypertension Father   . Hyperlipidemia Father   . Coronary artery disease Father   . Hodgkin's lymphoma Mother 59    deceased  . Cancer Neg Hx     breast, colon  . Colon cancer Neg Hx   . Rectal cancer Neg Hx   . Stomach cancer Neg Hx   . Esophageal cancer Neg Hx     Past Medical History  Diagnosis Date  . Hyperlipidemia   . Ejection fraction     EF 60%, catheterization, February, 2013  . Paget's disease of bone     Noted on chest CT scan  . Type II or unspecified type diabetes mellitus without mention of complication, uncontrolled   . Other disorders of bone and  cartilage   . CAD in native artery Jan '13    NSTEMI 07/2011: LHC 07/03/11: Proximal LAD 50-70%, circumflex 95% mid, ostial RCA 50% EF 60%.  PCI: Resolute DES to the mid circumflex  . HTN (hypertension)   . Tobacco abuse   . Bronchitis     February, 2013    Past Surgical History  Procedure Date  . Abdominal hysterectomy   . Coronary angioplasty with stent placement     Patient Active Problem List  Diagnosis  . DIAB W/O MENTION COMP TYPE II/UNS TYPE UNCNTRL  . Mixed hyperlipidemia  . PSORIASIS  . SHOULDER PAIN, RIGHT  . OTHER DISORDERS OF BONE AND CARTILAGE OTHER  . CHICKENPOX, HX OF  . Paget's disease of bone  . Tobacco abuse  . CAD in native artery  . HTN (hypertension)    ROS   Patient denies fever, chills, headache, sweats, rash, change in vision, change in hearing, chest pain, cough, nausea vomiting, urinary symptoms. All other systems are reviewed and are negative.  PHYSICAL EXAM  Patient is oriented to  person time and place. Affect is normal. There is no jugulovenous distention. Lungs are clear. Respiratory effort is nonlabored. Cardiac exam reveals S1 and S2. There no clicks or significant murmurs. The abdomen is soft. There is no peripheral edema.  EKG is done today and reviewed by me. It is normal. There is no change.  ASSESSMENT & PLAN

## 2012-05-16 ENCOUNTER — Encounter (INDEPENDENT_AMBULATORY_CARE_PROVIDER_SITE_OTHER): Payer: 59

## 2012-05-16 DIAGNOSIS — I6529 Occlusion and stenosis of unspecified carotid artery: Secondary | ICD-10-CM

## 2012-05-16 DIAGNOSIS — I2510 Atherosclerotic heart disease of native coronary artery without angina pectoris: Secondary | ICD-10-CM

## 2012-05-22 ENCOUNTER — Encounter: Payer: Self-pay | Admitting: Cardiology

## 2012-05-22 DIAGNOSIS — I739 Peripheral vascular disease, unspecified: Secondary | ICD-10-CM

## 2012-05-22 DIAGNOSIS — I779 Disorder of arteries and arterioles, unspecified: Secondary | ICD-10-CM | POA: Insufficient documentation

## 2012-06-22 ENCOUNTER — Other Ambulatory Visit (HOSPITAL_COMMUNITY): Payer: Self-pay | Admitting: Cardiology

## 2012-07-15 ENCOUNTER — Other Ambulatory Visit: Payer: Self-pay

## 2012-08-07 ENCOUNTER — Other Ambulatory Visit: Payer: Self-pay | Admitting: *Deleted

## 2012-08-07 MED ORDER — GLIMEPIRIDE 2 MG PO TABS: 2.0000 mg | ORAL_TABLET | Freq: Every day | ORAL | Status: DC

## 2012-09-04 ENCOUNTER — Other Ambulatory Visit: Payer: Self-pay

## 2012-09-04 MED ORDER — GLIMEPIRIDE 2 MG PO TABS: 2.0000 mg | ORAL_TABLET | Freq: Every day | ORAL | Status: DC

## 2012-10-02 ENCOUNTER — Other Ambulatory Visit: Payer: Self-pay | Admitting: *Deleted

## 2012-10-02 MED ORDER — ROSUVASTATIN CALCIUM 40 MG PO TABS: 40.0000 mg | ORAL_TABLET | Freq: Every day | ORAL | Status: DC

## 2012-10-27 ENCOUNTER — Ambulatory Visit (INDEPENDENT_AMBULATORY_CARE_PROVIDER_SITE_OTHER): Payer: 59 | Admitting: Cardiology

## 2012-10-27 ENCOUNTER — Encounter: Payer: Self-pay | Admitting: Cardiology

## 2012-10-27 ENCOUNTER — Other Ambulatory Visit: Payer: Self-pay

## 2012-10-27 VITALS — BP 118/76 | HR 76 | Ht 63.0 in | Wt 173.0 lb

## 2012-10-27 DIAGNOSIS — I2510 Atherosclerotic heart disease of native coronary artery without angina pectoris: Secondary | ICD-10-CM

## 2012-10-27 DIAGNOSIS — I779 Disorder of arteries and arterioles, unspecified: Secondary | ICD-10-CM

## 2012-10-27 DIAGNOSIS — E782 Mixed hyperlipidemia: Secondary | ICD-10-CM

## 2012-10-27 MED ORDER — ROSUVASTATIN CALCIUM 40 MG PO TABS: 40.0000 mg | ORAL_TABLET | Freq: Every day | ORAL | Status: DC

## 2012-10-27 NOTE — Progress Notes (Signed)
HPI  Patient is seen today to followup coronary artery disease. She continues to do very well. She has done a good job following up with her primary physician concerning all of her medical problems. She's not having any chest pain or shortness of breath. Her coronary intervention was done February, 2013. She is still on aspirin and Plavix.  No Known Allergies  Current Outpatient Prescriptions  Medication Sig Dispense Refill  . aspirin 81 MG tablet Take 81 mg by mouth daily.      . carvedilol (COREG) 6.25 MG tablet TAKE 1 TABLET BY MOUTH TWICE DAILY WITH A MEAL  60 tablet  2  . glimepiride (AMARYL) 2 MG tablet Take 1 tablet (2 mg total) by mouth daily before breakfast.  30 tablet  5  . nitroGLYCERIN (NITROSTAT) 0.4 MG SL tablet Place 1 tablet (0.4 mg total) under the tongue every 5 (five) minutes x 3 doses as needed for chest pain (up to 3 doses).  25 tablet  3  . albuterol (PROAIR HFA) 108 (90 BASE) MCG/ACT inhaler Inhale 2 puffs into the lungs every 6 (six) hours as needed for wheezing.  1 Inhaler  0  . rosuvastatin (CRESTOR) 40 MG tablet Take 1 tablet (40 mg total) by mouth daily.  30 tablet  0  . [DISCONTINUED] lisinopril (PRINIVIL,ZESTRIL) 10 MG tablet Take 0.5 tablets (5 mg total) by mouth daily.  30 tablet  6   No current facility-administered medications for this visit.    History   Social History  . Marital Status: Widowed    Spouse Name: N/A    Number of Children: 1  . Years of Education: N/A   Occupational History  .  [employer]   Social History Main Topics  . Smoking status: Current Every Day Smoker -- 0.50 packs/day for 20 years    Types: Cigarettes  . Smokeless tobacco: Never Used     Comment: discussed cessation strategy  . Alcohol Use: No  . Drug Use: No  . Sexually Active: Not Currently   Other Topics Concern  . Not on file   Social History Narrative   [employer]. Married '84-14 yrs; divorced; married '06 - 5 yers/widowed. 1 son - '89. Work-quality  reviewer in an office setting.    Family History  Problem Relation Age of Onset  . Heart failure Father 17    deceased  . Diabetes Father   . Kidney failure Father   . Hypertension Father   . Hyperlipidemia Father   . Coronary artery disease Father   . Hodgkin's lymphoma Mother 63    deceased  . Cancer Neg Hx     breast, colon  . Colon cancer Neg Hx   . Rectal cancer Neg Hx   . Stomach cancer Neg Hx   . Esophageal cancer Neg Hx     Past Medical History  Diagnosis Date  . Hyperlipidemia   . Ejection fraction     EF 60%, catheterization, February, 2013  . Paget's disease of bone     Noted on chest CT scan  . Type II or unspecified type diabetes mellitus without mention of complication, uncontrolled   . Other disorders of bone and cartilage(733.99)   . CAD in native artery Jan '13    NSTEMI 07/2011: LHC 07/03/11: Proximal LAD 50-70%, circumflex 95% mid, ostial RCA 50% EF 60%.  PCI: Resolute DES to the mid circumflex  . HTN (hypertension)   . Tobacco abuse   . Bronchitis  February, 2013  . Carotid artery disease     Doppler, December, 2013, 0-39% R. ICA, 40-59% LICA    Past Surgical History  Procedure Laterality Date  . Abdominal hysterectomy    . Coronary angioplasty with stent placement      Patient Active Problem List   Diagnosis Date Noted  . Carotid artery disease   . HTN (hypertension) 07/23/2011  . CAD in native artery   . Tobacco abuse 07/04/2011  . Paget's disease of bone   . DIAB W/O MENTION COMP TYPE II/UNS TYPE UNCNTRL 02/13/2009  . Mixed hyperlipidemia 02/13/2009  . OTHER DISORDERS OF BONE AND CARTILAGE OTHER 02/13/2009  . PSORIASIS 06/27/2007  . SHOULDER PAIN, RIGHT 06/27/2007  . CHICKENPOX, HX OF 06/27/2007    ROS   Patient denies fever, chills, headache, sweats, rash, change in vision, change in hearing, chest pain, cough, nausea vomiting, urinary symptoms. All other systems are reviewed and are negative.  PHYSICAL EXAM  Patient is  oriented to person time and place. Affect is normal. Lungs are clear. Respiratory effort is nonlabored. Cardiac exam reveals S1 and S2. There no clicks or significant murmurs. The abdomen is soft. There is no peripheral edema.  Filed Vitals:   10/27/12 0941  BP: 118/76  Pulse: 76  Height: 5\' 3"  (1.6 m)  Weight: 173 lb (78.472 kg)  SpO2: 97%     ASSESSMENT & PLAN

## 2012-10-27 NOTE — Assessment & Plan Note (Signed)
She continues to do well after her intervention January, 2013. We will stop her Plavix after her current prescription runs out. She will remain on aspirin.

## 2012-10-27 NOTE — Assessment & Plan Note (Signed)
Her current lipids are being treated aggressively. No change in therapy.  I will plan to see her back in one year for cardiology followup.

## 2012-10-27 NOTE — Assessment & Plan Note (Signed)
We did check a carotid Doppler after I saw her last. She does have 40-59% stenosis of the LICA. This will be followed carefully.

## 2012-10-27 NOTE — Patient Instructions (Addendum)
Your physician has recommended you make the following change in your medication: sto taking Plavix when you finish current bottle  Your physician wants you to follow-up in: 1 year. You will receive a reminder letter in the mail two months in advance. If you don't receive a letter, please call our office to schedule the follow-up appointment.

## 2012-11-20 ENCOUNTER — Ambulatory Visit (INDEPENDENT_AMBULATORY_CARE_PROVIDER_SITE_OTHER): Payer: 59 | Admitting: Internal Medicine

## 2012-11-20 ENCOUNTER — Other Ambulatory Visit (INDEPENDENT_AMBULATORY_CARE_PROVIDER_SITE_OTHER): Payer: 59

## 2012-11-20 ENCOUNTER — Encounter: Payer: Self-pay | Admitting: Internal Medicine

## 2012-11-20 VITALS — BP 118/66 | HR 68 | Temp 98.0°F | Resp 12 | Ht 63.0 in | Wt 177.8 lb

## 2012-11-20 DIAGNOSIS — E782 Mixed hyperlipidemia: Secondary | ICD-10-CM

## 2012-11-20 DIAGNOSIS — L408 Other psoriasis: Secondary | ICD-10-CM

## 2012-11-20 DIAGNOSIS — F17200 Nicotine dependence, unspecified, uncomplicated: Secondary | ICD-10-CM

## 2012-11-20 DIAGNOSIS — I779 Disorder of arteries and arterioles, unspecified: Secondary | ICD-10-CM

## 2012-11-20 DIAGNOSIS — IMO0001 Reserved for inherently not codable concepts without codable children: Secondary | ICD-10-CM

## 2012-11-20 DIAGNOSIS — I10 Essential (primary) hypertension: Secondary | ICD-10-CM

## 2012-11-20 DIAGNOSIS — M948X9 Other specified disorders of cartilage, unspecified sites: Secondary | ICD-10-CM

## 2012-11-20 DIAGNOSIS — M889 Osteitis deformans of unspecified bone: Secondary | ICD-10-CM

## 2012-11-20 DIAGNOSIS — Z1211 Encounter for screening for malignant neoplasm of colon: Secondary | ICD-10-CM

## 2012-11-20 DIAGNOSIS — I2510 Atherosclerotic heart disease of native coronary artery without angina pectoris: Secondary | ICD-10-CM

## 2012-11-20 DIAGNOSIS — Z720 Tobacco use: Secondary | ICD-10-CM

## 2012-11-20 DIAGNOSIS — Z0000 Encounter for general adult medical examination without abnormal findings: Secondary | ICD-10-CM

## 2012-11-20 LAB — COMPREHENSIVE METABOLIC PANEL
ALT: 16 U/L (ref 0–35)
AST: 14 U/L (ref 0–37)
Albumin: 3.7 g/dL (ref 3.5–5.2)
CO2: 27 mEq/L (ref 19–32)
Calcium: 8.7 mg/dL (ref 8.4–10.5)
Chloride: 104 mEq/L (ref 96–112)
Potassium: 4.3 mEq/L (ref 3.5–5.1)
Sodium: 137 mEq/L (ref 135–145)
Total Protein: 7.7 g/dL (ref 6.0–8.3)

## 2012-11-20 LAB — LIPID PANEL
HDL: 35 mg/dL — ABNORMAL LOW (ref >39.00)
Total CHOL/HDL Ratio: 3

## 2012-11-20 LAB — HEPATIC FUNCTION PANEL
Albumin: 3.7 g/dL (ref 3.5–5.2)
Total Protein: 7.7 g/dL (ref 6.0–8.3)

## 2012-11-20 LAB — HEMOGLOBIN A1C: Hgb A1c MFr Bld: 8.2 % — ABNORMAL HIGH (ref 4.6–6.5)

## 2012-11-20 NOTE — Assessment & Plan Note (Signed)
Smoking cessation is ESSENTIAL in regard to risk reduction in regard cardiovascular disease in a patient with CAD/Stent and carotid disease. Discussed mechanism of addiction and the critical health impact of continued nicotine addiction!!!  Plan Reviewed cessation strategy of smoking behavior analysis and developing alternative behavior strategies.  Provided handout on tips for cessation.  20 min + spent on discussion.

## 2012-11-20 NOTE — Patient Instructions (Addendum)
Thanks for coming to see me.  Please stop smoking: smoker's diary, alternative behavioral strategy. You are not stupid, the tobacco industry is Evil and nicotine is THE most powerful addicting substance. This is way important.  Full labs today and results will be posted to my chart.  You will hear about appts for colonosocpy and to see Dr. Arelia Sneddon.  Schedule DEXA today.  Come back to see me in 2 months - smoking cessation follow-up.  Smoking Cessation, Tips for Success YOU CAN QUIT SMOKING If you are ready to quit smoking, congratulations! You have chosen to help yourself be healthier. Cigarettes bring nicotine, tar, carbon monoxide, and other irritants into your body. Your lungs, heart, and blood vessels will be able to work better without these poisons. There are many different ways to quit smoking. Nicotine gum, nicotine patches, a nicotine inhaler, or nicotine nasal spray can help with physical craving. Hypnosis, support groups, and medicines help break the habit of smoking. Here are some tips to help you quit for good.  Throw away all cigarettes.  Clean and remove all ashtrays from your home, work, and car.  On a card, write down your reasons for quitting. Carry the card with you and read it when you get the urge to smoke.  Cleanse your body of nicotine. Drink enough water and fluids to keep your urine clear or pale yellow. Do this after quitting to flush the nicotine from your body.  Learn to predict your moods. Do not let a bad situation be your excuse to have a cigarette. Some situations in your life might tempt you into wanting a cigarette.  Never have "just one" cigarette. It leads to wanting another and another. Remind yourself of your decision to quit.  Change habits associated with smoking. If you smoked while driving or when feeling stressed, try other activities to replace smoking. Stand up when drinking your coffee. Brush your teeth after eating. Sit in a different chair  when you read the paper. Avoid alcohol while trying to quit, and try to drink fewer caffeinated beverages. Alcohol and caffeine may urge you to smoke.  Avoid foods and drinks that can trigger a desire to smoke, such as sugary or spicy foods and alcohol.  Ask people who smoke not to smoke around you.  Have something planned to do right after eating or having a cup of coffee. Take a walk or exercise to perk you up. This will help to keep you from overeating.  Try a relaxation exercise to calm you down and decrease your stress. Remember, you may be tense and nervous for the first 2 weeks after you quit, but this will pass.  Find new activities to keep your hands busy. Play with a pen, coin, or rubber band. Doodle or draw things on paper.  Brush your teeth right after eating. This will help cut down on the craving for the taste of tobacco after meals. You can try mouthwash, too.  Use oral substitutes, such as lemon drops, carrots, a cinnamon stick, or chewing gum, in place of cigarettes. Keep them handy so they are available when you have the urge to smoke.  When you have the urge to smoke, try deep breathing.  Designate your home as a nonsmoking area.  If you are a heavy smoker, ask your caregiver about a prescription for nicotine chewing gum. It can ease your withdrawal from nicotine.  Reward yourself. Set aside the cigarette money you save and buy yourself something nice.  Look  for support from others. Join a support group or smoking cessation program. Ask someone at home or at work to help you with your plan to quit smoking.  Always ask yourself, "Do I need this cigarette or is this just a reflex?" Tell yourself, "Today, I choose not to smoke," or "I do not want to smoke." You are reminding yourself of your decision to quit, even if you do smoke a cigarette. HOW WILL I FEEL WHEN I QUIT SMOKING?  The benefits of not smoking start within days of quitting.  You may have symptoms of  withdrawal because your body is used to nicotine (the addictive substance in cigarettes). You may crave cigarettes, be irritable, feel very hungry, cough often, get headaches, or have difficulty concentrating.  The withdrawal symptoms are only temporary. They are strongest when you first quit but will go away within 10 to 14 days.  When withdrawal symptoms occur, stay in control. Think about your reasons for quitting. Remind yourself that these are signs that your body is healing and getting used to being without cigarettes.  Remember that withdrawal symptoms are easier to treat than the major diseases that smoking can cause.  Even after the withdrawal is over, expect periodic urges to smoke. However, these cravings are generally short-lived and will go away whether you smoke or not. Do not smoke!  If you relapse and smoke again, do not lose hope. Most smokers quit 3 times before they are successful.  If you relapse, do not give up! Plan ahead and think about what you will do the next time you get the urge to smoke. LIFE AS A NONSMOKER: MAKE IT FOR A MONTH, MAKE IT FOR LIFE Day 1: Hang this page where you will see it every day. Day 2: Get rid of all ashtrays, matches, and lighters. Day 3: Drink water. Breathe deeply between sips. Day 4: Avoid places with smoke-filled air, such as bars, clubs, or the smoking section of restaurants. Day 5: Keep track of how much money you save by not smoking. Day 6: Avoid boredom. Keep a good book with you or go to the movies. Day 7: Reward yourself! One week without smoking! Day 8: Make a dental appointment to get your teeth cleaned. Day 9: Decide how you will turn down a cigarette before it is offered to you. Day 10: Review your reasons for quitting. Day 11: Distract yourself. Stay active to keep your mind off smoking and to relieve tension. Take a walk, exercise, read a book, do a crossword puzzle, or try a new hobby. Day 12: Exercise. Get off the bus  before your stop or use stairs instead of escalators. Day 13: Call on friends for support and encouragement. Day 14: Reward yourself! Two weeks without smoking! Day 15: Practice deep breathing exercises. Day 16: Bet a friend that you can stay a nonsmoker. Day 17: Ask to sit in nonsmoking sections of restaurants. Day 18: Hang up "No Smoking" signs. Day 19: Think of yourself as a nonsmoker. Day 20: Each morning, tell yourself you will not smoke. Day 21: Reward yourself! Three weeks without smoking! Day 22: Think of smoking in negative ways. Remember how it stains your teeth, gives you bad breath, and leaves you short of breath. Day 23: Eat a nutritious breakfast. Day 24:Do not relive your days as a smoker. Day 25: Hold a pencil in your hand when talking on the telephone. Day 26: Tell all your friends you do not smoke. Day 27: Think about  how much better food tastes. Day 28: Remember, one cigarette is one too many. Day 29: Take up a hobby that will keep your hands busy. Day 30: Congratulations! One month without smoking! Give yourself a big reward. Your caregiver can direct you to community resources or hospitals for support, which may include:  Group support.  Education.  Hypnosis.  Subliminal therapy. Document Released: 02/13/2004 Document Revised: 08/09/2011 Document Reviewed: 03/03/2009 [facility] Patient Information 2014 [place], [place].

## 2012-11-20 NOTE — Assessment & Plan Note (Signed)
Stable - no chest pain or other cardiac symptoms.  Much work to be done re: risk reduction. She must stop smoking; diabetes must be brought under better control  She is to see Dr. Katrinka Blazing as directed.

## 2012-11-20 NOTE — Assessment & Plan Note (Signed)
Normal exam w/o bruits. Risk reduction is key - as above. Last carotid doppler Dec '13 - due for follow up Dec '14

## 2012-11-20 NOTE — Assessment & Plan Note (Signed)
No prior h/o osteoporosis but at risk.  Plan  For DEXA with recommnedations to follow  Basics: total calcium 1200 mg daily (food + supplement); Vit D 800-1,000 iu daily

## 2012-11-20 NOTE — Assessment & Plan Note (Signed)
Poor control, especially in the setting of known CAD and peripheral vascular disease.  Plan Follow up OV to discuss additional treatment options, i.e. Adding another oral agernt, e.g. actos vs basal insulin therapy which will offer greatest protection.

## 2012-11-20 NOTE — Progress Notes (Signed)
Subjective:    Patient ID: Monica Michael, female    DOB: September 20, 1960, 52 y.o.   MRN: [id number]  HPI Monica Michael presents for routine exam. 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 She has tried most treatments and feels the cures were worse than the disease. Otherwise she is doing well. She not seen gynecology for four years.   Past Medical History  Diagnosis Date  . Hyperlipidemia   . Ejection fraction     EF 60%, catheterization, February, 2013  . Paget's disease of bone     Noted on chest CT scan  . Type II or unspecified type diabetes mellitus without mention of complication, uncontrolled   . Other disorders of bone and cartilage(733.99)   . CAD in native artery Jan '13    NSTEMI 07/2011: LHC 07/03/11: Proximal LAD 50-70%, circumflex 95% mid, ostial RCA 50% EF 60%.  PCI: Resolute DES to the mid circumflex  . HTN (hypertension)   . Tobacco abuse   . Bronchitis     February, 2013  . Carotid artery disease     Doppler, December, 2013, 0-39% R. ICA, 40-59% LICA   Past Surgical History  Procedure Laterality Date  . Abdominal hysterectomy    . Coronary angioplasty with stent placement     Family History  Problem Relation Age of Onset  . Heart failure Father 61    deceased  . Diabetes Father   . Kidney failure Father   . Hypertension Father   . Hyperlipidemia Father   . Coronary artery disease Father   . Hodgkin's lymphoma Mother 62    deceased  . Cancer Neg Hx     breast, colon  . Colon cancer Neg Hx   . Rectal cancer Neg Hx   . Stomach cancer Neg Hx   . Esophageal cancer Neg Hx    History   Social History  . Marital Status: Widowed    Spouse Name: N/A    Number of Children: 1  . Years of Education: 16   Occupational History  .  [employer]   Social History Main Topics  . Smoking status: Current Some Day Smoker -- 0.50 packs/day for 20 years    Types: Cigarettes  . Smokeless  tobacco: Never Used     Comment: discussed cessation strategy  . Alcohol Use: No  . Drug Use: No  . Sexually Active: Not Currently   Other Topics Concern  . Not on file   Social History Narrative   [employer]. Married '84-14 yrs; divorced; married '06 - 5 yers/widowed. 1 son - '89. Work-quality reviewer in an office setting.    Current Outpatient Prescriptions on File Prior to Visit  Medication Sig Dispense Refill  . aspirin 81 MG tablet Take 81 mg by mouth daily.      . carvedilol (COREG) 6.25 MG tablet TAKE 1 TABLET BY MOUTH TWICE DAILY WITH A MEAL  60 tablet  2  . glimepiride (AMARYL) 2 MG tablet Take 1 tablet (2 mg total) by mouth daily before breakfast.  30 tablet  5  . rosuvastatin (CRESTOR) 40 MG tablet Take 1 tablet (40 mg total) by mouth daily.  30 tablet  0  . albuterol (PROAIR HFA) 108 (90 BASE) MCG/ACT inhaler Inhale 2 puffs into the lungs every 6 (six) hours as needed for wheezing.  1 Inhaler  0  . nitroGLYCERIN (NITROSTAT) 0.4 MG SL tablet Place 1  tablet (0.4 mg total) under the tongue every 5 (five) minutes x 3 doses as needed for chest pain (up to 3 doses).  25 tablet  3  . [DISCONTINUED] lisinopril (PRINIVIL,ZESTRIL) 10 MG tablet Take 0.5 tablets (5 mg total) by mouth daily.  30 tablet  6   No current facility-administered medications on file prior to visit.      Review of Systems Constitutional:  Negative for fever, chills, activity change and unexpected weight change.  HEENT:  Negative for hearing loss, ear pain, congestion, neck stiffness and postnasal drip. Negative for sore throat or swallowing problems. Negative for dental complaints.   Eyes: Negative for vision loss or change in visual acuity.  Respiratory: Negative for chest tightness and wheezing. Negative for DOE.   Cardiovascular: Negative for chest pain or palpitations. No decreased exercise tolerance Gastrointestinal: No change in bowel habit. No bloating or gas. No reflux or indigestion Genitourinary:  Negative for urgency, frequency, flank pain and difficulty urinating.  Musculoskeletal: Negative for myalgias, back pain, arthralgias and gait problem.  Neurological: Negative for dizziness, tremors, weakness and headaches.  Hematological: Negative for adenopathy.  Psychiatric/Behavioral: Negative for behavioral problems and dysphoric mood.       Objective:   Physical Exam Filed Vitals:   11/20/12 0952  BP: 118/66  Pulse: 68  Temp: 98 F (36.7 C)  Resp: 12   Wt Readings from Last 3 Encounters:  11/20/12 177 lb 12.8 oz (80.65 kg)  10/27/12 173 lb (78.472 kg)  04/25/12 164 lb 6.4 oz (74.571 kg)   Gen'l: well nourished, well developed white Woman in no distress HEENT - [place]/AT, EACs/TMs normal, oropharynx with native dentition in good condition, no buccal or palatal lesions, posterior pharynx clear, mucous membranes moist. C&S clear, PERRLA, fundi - normal Neck - supple, no thyromegaly Nodes- negative submental, cervical, supraclavicular regions Chest - no deformity, no CVAT Lungs - clear without rales, wheezes. No increased work of breathing Breast - deferred to gyn Cardiovascular - regular rate and rhythm, quiet precordium, no murmurs, rubs or gallops, 2+ radial, DP and PT pulses Abdomen - BS+ x 4, no HSM, no guarding or rebound or tenderness Pelvic - deferred to gyn Rectal - deferred to GI Extremities - no clubbing, cyanosis, edema or deformity.  Neuro - A&O x 3, CN II-XII normal, motor strength normal and equal, DTRs 2+ and symmetrical biceps, radial, and patellar tendons. Cerebellar - no tremor, no rigidity, fluid movement and normal gait. Derm - Head, neck, back, abdomen and extremities without suspicious lesions  Recent Results (from the past 2160 hour(s))  HEMOGLOBIN A1C     Status: Abnormal   Collection Time    11/20/12 11:02 AM      Result Value Range   Hemoglobin A1C 8.2 (*) 4.6 - 6.5 %   Comment: Glycemic Control Guidelines for People with Diabetes:Non Diabetic:   <6%Goal of Therapy: <7%Additional Action Suggested:  >8%   HEPATIC FUNCTION PANEL     Status: None   Collection Time    11/20/12 11:02 AM      Result Value Range   Total Bilirubin 0.3  0.3 - 1.2 mg/dL   Bilirubin, Direct 0.1  0.0 - 0.3 mg/dL   Alkaline Phosphatase 107  39 - 117 U/L   AST 14  0 - 37 U/L   ALT 16  0 - 35 U/L   Total Protein 7.7  6.0 - 8.3 g/dL   Albumin 3.7  3.5 - 5.2 g/dL  COMPREHENSIVE METABOLIC PANEL  Status: Abnormal   Collection Time    11/20/12 11:02 AM      Result Value Range   Sodium 137  135 - 145 mEq/L   Potassium 4.3  3.5 - 5.1 mEq/L   Chloride 104  96 - 112 mEq/L   CO2 27  19 - 32 mEq/L   Glucose, Bld 225 (*) 70 - 99 mg/dL   BUN 10  6 - 23 mg/dL   Creatinine, Ser 0.8  0.4 - 1.2 mg/dL   Total Bilirubin 0.3  0.3 - 1.2 mg/dL   Alkaline Phosphatase 107  39 - 117 U/L   AST 14  0 - 37 U/L   ALT 16  0 - 35 U/L   Total Protein 7.7  6.0 - 8.3 g/dL   Albumin 3.7  3.5 - 5.2 g/dL   Calcium 8.7  8.4 - 16.1 mg/dL   GFR 09.60  >45.40 mL/min  LIPID PANEL     Status: Abnormal   Collection Time    11/20/12 11:02 AM      Result Value Range   Cholesterol 99  0 - 200 mg/dL   Comment: ATP III Classification       Desirable:  < 200 mg/dL               Borderline High:  200 - 239 mg/dL          High:  > = 981 mg/dL   Triglycerides 19.1  0.0 - 149.0 mg/dL   Comment: Normal:  <478 mg/dLBorderline High:  150 - 199 mg/dL   HDL 29.56 (*) >21.30 mg/dL   VLDL 86.5  0.0 - 78.4 mg/dL   LDL Cholesterol 49  0 - 99 mg/dL   Total CHOL/HDL Ratio 3     Comment:                Men          Women1/2 Average Risk     3.4          3.3Average Risk          5.0          4.42X Average Risk          9.6          7.13X Average Risk          15.0          11.0                             Assessment & Plan:

## 2012-11-20 NOTE — Assessment & Plan Note (Signed)
No active Heme/Onc consult in EPIC  Plan Will need referral for best management

## 2012-11-20 NOTE — Assessment & Plan Note (Signed)
Excellent control with LDL well below target of 100 or less.  Plan  Continue present regimen.

## 2012-11-20 NOTE — Assessment & Plan Note (Signed)
Inteval history w/o major medical problems, surgery or injury. Physical exam, sans breast and pelvic, normal except for psoriatic rash on hands. She is referred to DEXA scan, GI for colonoscopy and to Gyn (Dr. Arelia Sneddon) for routine woman exam. Immunizations are brought up to date. Encouraged regular exercise.  In summary  A nice woman who appears medically stable but with active metabolic disease needing close follow up along with absolute necessity for smoking cessation. She will returnin 1-2 weeks and again in 2 months.

## 2012-11-20 NOTE — Assessment & Plan Note (Signed)
Currently off medical therapy. She is having a flare of palmer psoriasis  Plan Topical steroid treatment if symptoms worsen

## 2012-11-20 NOTE — Assessment & Plan Note (Signed)
BP Readings from Last 3 Encounters:  11/20/12 118/66  10/27/12 118/76  04/25/12 100/64   Good control of blood pressure on current regimen

## 2012-11-28 ENCOUNTER — Ambulatory Visit (INDEPENDENT_AMBULATORY_CARE_PROVIDER_SITE_OTHER)
Admission: RE | Admit: 2012-11-28 | Discharge: 2012-11-28 | Disposition: A | Discharge status: Home / Self Care | Payer: 59 | Source: Ambulatory Visit | Attending: Internal Medicine | Admitting: Internal Medicine

## 2012-11-28 DIAGNOSIS — M948X9 Other specified disorders of cartilage, unspecified sites: Secondary | ICD-10-CM

## 2012-12-03 ENCOUNTER — Other Ambulatory Visit (HOSPITAL_COMMUNITY): Payer: Self-pay | Admitting: Cardiology

## 2012-12-04 ENCOUNTER — Ambulatory Visit: Payer: 59 | Admitting: Internal Medicine

## 2012-12-05 ENCOUNTER — Telehealth: Payer: Self-pay | Admitting: Oncology

## 2012-12-05 ENCOUNTER — Encounter: Payer: Self-pay | Admitting: Internal Medicine

## 2012-12-05 ENCOUNTER — Ambulatory Visit (INDEPENDENT_AMBULATORY_CARE_PROVIDER_SITE_OTHER): Payer: 59 | Admitting: Internal Medicine

## 2012-12-05 VITALS — BP 112/68 | HR 67 | Temp 98.2°F | Resp 16 | Wt 172.0 lb

## 2012-12-05 DIAGNOSIS — IMO0001 Reserved for inherently not codable concepts without codable children: Secondary | ICD-10-CM

## 2012-12-05 NOTE — Progress Notes (Signed)
  Subjective:    Patient ID: Monica Michael, female    DOB: [birth year], 52 y.o.   MRN: [id number]  HPI Ms. Sassaman presents due to increase A1c for discussion med change.  She is feeling fine  PMH, FamHx and SocHx reviewed for any changes and relevance. Current Outpatient Prescriptions on File Prior to Visit  Medication Sig Dispense Refill  . aspirin 81 MG tablet Take 81 mg by mouth daily.      . carvedilol (COREG) 6.25 MG tablet TAKE 1 TABLET BY MOUTH TWICE DAILY WITH A MEAL  60 tablet  3  . clopidogrel (PLAVIX) 75 MG tablet Take 75 mg by mouth daily.      Marland Kitchen glimepiride (AMARYL) 2 MG tablet Take 1 tablet (2 mg total) by mouth daily before breakfast.  30 tablet  5  . rosuvastatin (CRESTOR) 40 MG tablet Take 1 tablet (40 mg total) by mouth daily.  30 tablet  0  . albuterol (PROAIR HFA) 108 (90 BASE) MCG/ACT inhaler Inhale 2 puffs into the lungs every 6 (six) hours as needed for wheezing.  1 Inhaler  0  . nitroGLYCERIN (NITROSTAT) 0.4 MG SL tablet Place 1 tablet (0.4 mg total) under the tongue every 5 (five) minutes x 3 doses as needed for chest pain (up to 3 doses).  25 tablet  3  . [DISCONTINUED] lisinopril (PRINIVIL,ZESTRIL) 10 MG tablet Take 0.5 tablets (5 mg total) by mouth daily.  30 tablet  6   No current facility-administered medications on file prior to visit.      Review of Systems System review is negative for any constitutional, cardiac, pulmonary, GI or neuro symptoms or complaints other than as described in the HPI.     Objective:   Physical Exam Filed Vitals:   12/05/12 1120  BP: 112/68  Pulse: 67  Temp: 98.2 F (36.8 C)  Resp: 16   Gen'l WNWD woman in no distress  No further exam  (greater than 50% of 20 min visit spent on education and counseling)        Assessment & Plan:

## 2012-12-05 NOTE — Telephone Encounter (Signed)
LVOM FOR PT TO RETURN CALL IN RE NP APPT.  °

## 2012-12-05 NOTE — Assessment & Plan Note (Signed)
Lab Results  Component Value Date   HGBA1C 8.2* 11/20/2012   Discussed treatment options in light of intolerance of DPP-4 and metformin.   Plan -  invokana 100 mg daily  Follow CBGs  Renal profile in 2-3 weeks.

## 2012-12-05 NOTE — Patient Instructions (Signed)
Med change - to invokana with follow up lab.

## 2012-12-20 ENCOUNTER — Other Ambulatory Visit: Payer: 59

## 2012-12-20 DIAGNOSIS — IMO0001 Reserved for inherently not codable concepts without codable children: Secondary | ICD-10-CM

## 2012-12-20 LAB — COMPREHENSIVE METABOLIC PANEL
Albumin: 3.8 g/dL (ref 3.5–5.2)
BUN: 16 mg/dL (ref 6–23)
CO2: 23 mEq/L (ref 19–32)
Calcium: 9.1 mg/dL (ref 8.4–10.5)
Chloride: 103 mEq/L (ref 96–112)
Glucose, Bld: 220 mg/dL — ABNORMAL HIGH (ref 70–99)
Potassium: 4.1 mEq/L (ref 3.5–5.1)

## 2012-12-21 ENCOUNTER — Encounter: Payer: Self-pay | Admitting: Internal Medicine

## 2013-01-16 ENCOUNTER — Encounter: Payer: Self-pay | Admitting: Gastroenterology

## 2013-01-22 ENCOUNTER — Ambulatory Visit: Payer: 59 | Admitting: Internal Medicine

## 2013-01-31 ENCOUNTER — Ambulatory Visit (INDEPENDENT_AMBULATORY_CARE_PROVIDER_SITE_OTHER): Payer: 59 | Admitting: Internal Medicine

## 2013-01-31 ENCOUNTER — Encounter: Payer: Self-pay | Admitting: Internal Medicine

## 2013-01-31 VITALS — BP 110/62 | HR 75 | Temp 98.2°F | Wt 173.6 lb

## 2013-01-31 DIAGNOSIS — F17200 Nicotine dependence, unspecified, uncomplicated: Secondary | ICD-10-CM

## 2013-01-31 DIAGNOSIS — Z720 Tobacco use: Secondary | ICD-10-CM

## 2013-01-31 MED ORDER — BUPROPION HCL ER (SR) 150 MG PO TB12: 150.0000 mg | ORAL_TABLET | Freq: Two times a day (BID) | ORAL | Status: DC

## 2013-01-31 MED ORDER — GLIMEPIRIDE 2 MG PO TABS: 2.0000 mg | ORAL_TABLET | Freq: Every day | ORAL | Status: DC

## 2013-01-31 NOTE — Patient Instructions (Addendum)
Smoking cessation - thanks for doing the home work!!  Now: 1. Establish the alternative behavior strategy(ies), e.g. Walk in the park when you need a break 2. Since anxiety is a real issue - will start Wellbutrin SR 150 mg every AM start 1 week before your quit date. 3.  Set a quit date and then get ready. Sent me MyChart message when the date gets here  Diabetes - will restart amaryl 2 mg once a day. Continue Ivokana  Tremor - essential tremor - almost always benign. There are medications that help when it gets worse.

## 2013-02-01 NOTE — Progress Notes (Signed)
  Subjective:    Patient ID: Monica Michael, female    DOB: 08/07/1960, 52 y.o.   MRN: [id number]  HPI Monica Michael returns to discuss plans for smoking cessation - she is ready to go.   PMH, FamHx and SocHx reviewed for any changes and relevance.  Current Outpatient Prescriptions on File Prior to Visit  Medication Sig Dispense Refill  . aspirin 81 MG tablet Take 81 mg by mouth daily.      . carvedilol (COREG) 6.25 MG tablet TAKE 1 TABLET BY MOUTH TWICE DAILY WITH A MEAL  60 tablet  3  . rosuvastatin (CRESTOR) 40 MG tablet Take 1 tablet (40 mg total) by mouth daily.  30 tablet  0  . albuterol (PROAIR HFA) 108 (90 BASE) MCG/ACT inhaler Inhale 2 puffs into the lungs every 6 (six) hours as needed for wheezing.  1 Inhaler  0  . nitroGLYCERIN (NITROSTAT) 0.4 MG SL tablet Place 1 tablet (0.4 mg total) under the tongue every 5 (five) minutes x 3 doses as needed for chest pain (up to 3 doses).  25 tablet  3  . [DISCONTINUED] lisinopril (PRINIVIL,ZESTRIL) 10 MG tablet Take 0.5 tablets (5 mg total) by mouth daily.  30 tablet  6   No current facility-administered medications on file prior to visit.     Review of Systems System review is negative for any constitutional, cardiac, pulmonary, GI or neuro symptoms or complaints other than as described in the HPI.      Objective:   Physical Exam Filed Vitals:   01/31/13 1034  BP: 110/62  Pulse: 75  Temp: 98.2 F (36.8 C)   Gen'l WNWD overweight white woman Pulm n- normal respirations. Neuro - A&O x 3       Assessment & Plan:

## 2013-02-01 NOTE — Assessment & Plan Note (Signed)
She has completed smoker's diary. She has identified her moments of greatest temptation and has a plan for behavioral change. She has identified the issue of anxiety with cessation.  Plan Rx - Wellbutrin SR 150 mg daily  Set quit date and start wellburtin 1 week in advance  Initiate behavioral change now.  Communicate via Mychart when she stops.

## 2013-02-07 ENCOUNTER — Other Ambulatory Visit: Payer: Self-pay

## 2013-02-07 MED ORDER — GLIMEPIRIDE 2 MG PO TABS: 2.0000 mg | ORAL_TABLET | Freq: Every day | ORAL | Status: DC

## 2013-02-08 ENCOUNTER — Other Ambulatory Visit: Payer: Self-pay

## 2013-02-08 MED ORDER — CARVEDILOL 6.25 MG PO TABS: ORAL_TABLET | ORAL | Status: DC

## 2013-03-13 ENCOUNTER — Ambulatory Visit (AMBULATORY_SURGERY_CENTER): Payer: Self-pay

## 2013-03-13 VITALS — Ht 63.0 in | Wt 173.8 lb

## 2013-03-13 DIAGNOSIS — Z1211 Encounter for screening for malignant neoplasm of colon: Secondary | ICD-10-CM

## 2013-03-13 MED ORDER — NA SULFATE-K SULFATE-MG SULF 17.5-3.13-1.6 GM/177ML PO SOLN: ORAL | Status: DC

## 2013-03-16 ENCOUNTER — Encounter: Payer: Self-pay | Admitting: Gastroenterology

## 2013-03-25 ENCOUNTER — Emergency Department (HOSPITAL_COMMUNITY)
Admission: EM | Admit: 2013-03-25 | Discharge: 2013-03-25 | Disposition: A | Discharge status: Home / Self Care | Payer: 59 | Source: Home / Self Care | Attending: Family Medicine | Admitting: Family Medicine

## 2013-03-25 ENCOUNTER — Encounter (HOSPITAL_COMMUNITY): Payer: Self-pay | Admitting: Emergency Medicine

## 2013-03-25 DIAGNOSIS — N390 Urinary tract infection, site not specified: Secondary | ICD-10-CM

## 2013-03-25 DIAGNOSIS — R319 Hematuria, unspecified: Secondary | ICD-10-CM

## 2013-03-25 HISTORY — DX: Urinary tract infection, site not specified: N39.0

## 2013-03-25 LAB — POCT URINALYSIS DIP (DEVICE)
Bilirubin Urine: NEGATIVE
Glucose, UA: >=1000 mg/dL — AB
Ketones, ur: NEGATIVE mg/dL
Leukocytes, UA: NEGATIVE
Nitrite: NEGATIVE
Protein, ur: NEGATIVE mg/dL
Specific Gravity, Urine: 1.01 (ref 1.005–1.030)
Urobilinogen, UA: 0.2 mg/dL (ref 0.0–1.0)

## 2013-03-25 LAB — POCT PREGNANCY, URINE: Preg Test, Ur: NEGATIVE

## 2013-03-25 MED ORDER — CEPHALEXIN 500 MG PO CAPS: 1000.0000 mg | ORAL_CAPSULE | Freq: Three times a day (TID) | ORAL | Status: DC

## 2013-03-25 NOTE — ED Provider Notes (Signed)
CSN: [id number]     Arrival date & time 03/25/13  1304 History   First MD Initiated Contact with Patient 03/25/13 1435     Chief Complaint  Patient presents with  . Urinary Tract Infection   (Consider location/radiation/quality/duration/timing/severity/associated sxs/prior Treatment) HPI Comments: Patient presents complaining of Possible UTI that started this morning. Notes she takes Invokana for her diabetes and she may be more prone to UTIs because this causes her to lose glucose in the urine. Has urinary frequency, urgency, burning. Feels exactly the same as previous UTIs. Denies fever, chills, abdominal pain, flank pain, NVD.   Past Medical History  Diagnosis Date  . Hyperlipidemia   . Ejection fraction     EF 60%, catheterization, February, 2013  . Paget's disease of bone     Noted on chest CT scan  . Type II or unspecified type diabetes mellitus without mention of complication, uncontrolled   . Other disorders of bone and cartilage(733.99)   . CAD in native artery Jan '13    NSTEMI 07/2011: LHC 07/03/11: Proximal LAD 50-70%, circumflex 95% mid, ostial RCA 50% EF 60%.  PCI: Resolute DES to the mid circumflex  . HTN (hypertension)   . Tobacco abuse   . Bronchitis     February, 2013  . Carotid artery disease     Doppler, December, 2013, 0-39% R. ICA, 40-59% LICA  . Myocardial infarction     07/2011   Past Surgical History  Procedure Laterality Date  . Abdominal hysterectomy  2001  . Coronary angioplasty with stent placement  07/2011    1 stent   Family History  Problem Relation Age of Onset  . Heart failure Father 37    deceased  . Diabetes Father   . Kidney failure Father   . Hypertension Father   . Hyperlipidemia Father   . Coronary artery disease Father   . Hodgkin's lymphoma Mother 47    deceased  . Cancer Neg Hx     breast, colon  . Colon cancer Neg Hx   . Rectal cancer Neg Hx   . Stomach cancer Neg Hx   . Esophageal cancer Neg Hx    History  Substance  Use Topics  . Smoking status: Current Some Day Smoker -- 0.50 packs/day for 20 years    Types: Cigarettes  . Smokeless tobacco: Never Used     Comment: discussed cessation strategy  . Alcohol Use: No   OB History   Grav Para Term Preterm Abortions TAB SAB Ect Mult Living                 Review of Systems  Constitutional: Negative for fever and chills.  Eyes: Negative for visual disturbance.  Respiratory: Negative for cough and shortness of breath.   Cardiovascular: Negative for chest pain, palpitations and leg swelling.  Gastrointestinal: Negative for nausea, vomiting and abdominal pain.  Endocrine: Negative for polydipsia and polyuria.  Genitourinary: Positive for dysuria, urgency, frequency and hematuria. Negative for flank pain.  Musculoskeletal: Negative for arthralgias and myalgias.  Skin: Negative for rash.  Neurological: Negative for dizziness, weakness and light-headedness.    Allergies  Review of patient's allergies indicates no known allergies.  Home Medications   Current Outpatient Rx  Name  Route  Sig  Dispense  Refill  . EXPIRED: albuterol (PROAIR HFA) 108 (90 BASE) MCG/ACT inhaler   Inhalation   Inhale 2 puffs into the lungs every 6 (six) hours as needed for wheezing.   1 Inhaler  0   . aspirin 81 MG tablet   Oral   Take 81 mg by mouth daily.         Marland Kitchen buPROPion (WELLBUTRIN SR) 150 MG 12 hr tablet   Oral   Take 1 tablet (150 mg total) by mouth 2 (two) times daily.   60 tablet   5   . Canagliflozin (INVOKANA PO)   Oral   Take by mouth daily.         . carvedilol (COREG) 6.25 MG tablet      6.25 mg. TAKE 1/2 in am and 1/2 in pm         . cephALEXin (KEFLEX) 500 MG capsule   Oral   Take 2 capsules (1,000 mg total) by mouth 3 (three) times daily.   60 capsule   0   . glimepiride (AMARYL) 2 MG tablet   Oral   Take 1 tablet (2 mg total) by mouth daily before breakfast.   90 tablet   3   . Na Sulfate-K Sulfate-Mg Sulf (SUPREP BOWEL  PREP) SOLN      Suprep as directed /no substitutions   354 mL   0   . EXPIRED: nitroGLYCERIN (NITROSTAT) 0.4 MG SL tablet   Sublingual   Place 0.4 mg under the tongue every 5 (five) minutes x 3 doses as needed for chest pain (up to 3 doses).         . rosuvastatin (CRESTOR) 40 MG tablet   Oral   Take 1 tablet (40 mg total) by mouth daily.   30 tablet   0     Please disregard prior RX for #90 with 3 refills.  ...   . Wheat Dextrin (BENEFIBER PO)   Oral   Take by mouth. Take 6 tsp daily          BP 111/70  Pulse 61  Temp(Src) 98.2 F (36.8 C) (Oral)  Resp 16  SpO2 100% Physical Exam  Nursing note and vitals reviewed. Constitutional: She is oriented to person, place, and time. Vital signs are normal. She appears well-developed and well-nourished. No distress.  HENT:  Head: Normocephalic and atraumatic.  Eyes: Conjunctivae are normal.  Pulmonary/Chest: Effort normal and breath sounds normal. No respiratory distress.  Abdominal: Soft. Bowel sounds are normal. She exhibits no distension. There is no tenderness. There is no rebound and no guarding.  Neurological: She is alert and oriented to person, place, and time. She has normal strength. Coordination normal.  Skin: Skin is warm and dry. No rash noted. She is not diaphoretic.  Psychiatric: She has a normal mood and affect. Judgment normal.    ED Course  Procedures (including critical care time) Labs Review Labs Reviewed  POCT URINALYSIS DIP (DEVICE) - Abnormal; Notable for the following:    Glucose, UA >=1000 (*)    Hgb urine dipstick MODERATE (*)    All other components within normal limits  URINE CULTURE  POCT PREGNANCY, URINE   Imaging Review No results found.    MDM   1. Hematuria   2. UTI (lower urinary tract infection)    Urine looks okay but symptoms are convincing. Will treat. Sending a culture. Followup as needed.     Meds ordered this encounter  Medications  . DISCONTD: cephALEXin  (KEFLEX) 500 MG capsule    Sig: Take 2 capsules (1,000 mg total) by mouth 3 (three) times daily.    Dispense:  60 capsule    Refill:  0  Order Specific Question:  Supervising Provider    Answer:  Clementeen Graham, Henrietta Dine  . cephALEXin (KEFLEX) 500 MG capsule    Sig: Take 2 capsules (1,000 mg total) by mouth 3 (three) times daily.    Dispense:  60 capsule    Refill:  0    Order Specific Question:  Supervising Provider    Answer:  Clementeen Graham, Kathie Rhodes [[id number]]        Graylon Good, PA-C 03/25/13 1451

## 2013-03-25 NOTE — ED Provider Notes (Signed)
Medical screening examination/treatment/procedure(s) were performed by a resident physician or non-physician practitioner and as the supervising physician I was immediately available for consultation/collaboration.  Clementeen Graham, MD   Rodolph Bong, MD 03/25/13 [phone]

## 2013-03-25 NOTE — ED Notes (Signed)
C/o uti symptoms started this morning which was frequent urination and blood in urine

## 2013-03-26 ENCOUNTER — Telehealth: Payer: Self-pay | Admitting: Gastroenterology

## 2013-03-26 LAB — URINE CULTURE

## 2013-03-26 NOTE — Telephone Encounter (Signed)
Dr. Arlyce Dice was not in the endoscopy unit.  I asked Dr. Erick Blinks if okay to proceed with colonoscopy tomorrow.  The pt was seen in urgent care yesterday and dx with a UTI.  She was given keflex to take for 10 days.  Per Dr. Rhea Belton the pt is ok to proceed with the colonoscopy tomorrow and start the pre as instructed in pre-visit.  Message given to the pt.  Maw

## 2013-03-26 NOTE — ED Notes (Signed)
Lab pending.

## 2013-03-27 ENCOUNTER — Encounter: Payer: Self-pay | Admitting: Gastroenterology

## 2013-03-27 ENCOUNTER — Ambulatory Visit (AMBULATORY_SURGERY_CENTER): Payer: 59 | Admitting: Gastroenterology

## 2013-03-27 VITALS — BP 123/71 | HR 52 | Temp 97.0°F | Resp 16 | Ht 63.0 in | Wt 173.0 lb

## 2013-03-27 DIAGNOSIS — K5730 Diverticulosis of large intestine without perforation or abscess without bleeding: Secondary | ICD-10-CM

## 2013-03-27 DIAGNOSIS — Z1211 Encounter for screening for malignant neoplasm of colon: Secondary | ICD-10-CM

## 2013-03-27 DIAGNOSIS — D126 Benign neoplasm of colon, unspecified: Secondary | ICD-10-CM

## 2013-03-27 DIAGNOSIS — K648 Other hemorrhoids: Secondary | ICD-10-CM

## 2013-03-27 LAB — GLUCOSE, CAPILLARY
Glucose-Capillary: 173 mg/dL — ABNORMAL HIGH (ref 70–99)
Glucose-Capillary: 129 mg/dL — ABNORMAL HIGH (ref 70–99)

## 2013-03-27 MED ORDER — SODIUM CHLORIDE 0.9 % IV SOLN: 500.0000 mL | INTRAVENOUS | Status: DC

## 2013-03-27 NOTE — Progress Notes (Signed)
Lidocaine-40mg IV prior to Propofol InductionPropofol given over incremental dosages 

## 2013-03-27 NOTE — Op Note (Signed)
[facility] [address] [place], [address]   COLONOSCOPY PROCEDURE REPORT  PATIENT: Monica, Michael  MR#: [id number] BIRTHDATE: 1961/03/14 , 52  yrs. old GENDER: Female ENDOSCOPIST: Louis Meckel, MD REFERRED AO:[id number] Esther Hardy, M.D. PROCEDURE DATE:  03/27/2013 PROCEDURE:   Colonoscopy with snare polypectomy and Colonoscopy with cold biopsy polypectomy First Screening Colonoscopy - Avg.  risk and is 50 yrs.  old or older Yes.  Prior Negative Screening - Now for repeat screening. N/A  History of Adenoma - Now for follow-up colonoscopy & has been > or = to 3 yrs.  N/A  Polyps Removed Today? Yes. ASA CLASS:   Class II INDICATIONS:average risk screening. MEDICATIONS: MAC sedation, administered by CRNA and propofol (Diprivan) 200mg  IV  DESCRIPTION OF PROCEDURE:   After the risks benefits and alternatives of the procedure were thoroughly explained, informed consent was obtained.  A digital rectal exam revealed no abnormalities of the rectum.   The LB [id number]  endoscope was introduced through the anus and advanced to the cecum, which was identified by both the appendix and ileocecal valve. No adverse events experienced.   The quality of the prep was Suprep good  The instrument was then slowly withdrawn as the colon was fully examined.      COLON FINDINGS: A sessile polyp measuring 2 mm in size was found in the descending colon.  A polypectomy was performed with cold forceps.   A sessile polyp measuring 5 mm in size was found in the sigmoid colon.  A polypectomy was performed with a cold snare.  The resection was complete and the polyp tissue was completely retrieved.   Moderate diverticulosis was noted in the sigmoid colon.   There was scattered diverticulosis noted in the descending colon and transverse colon.   Hemorrhoids were found.  Retroflexed views revealed no abnormalities. The time to cecum=3 minutes 15 seconds.  Withdrawal time=12  minutes 0 seconds.  The scope was withdrawn and the procedure completed. COMPLICATIONS: There were no complications.  ENDOSCOPIC IMPRESSION: 1.   Sessile polyp measuring 2 mm in size was found in the descending colon; polypectomy was performed with cold forceps 2.   Sessile polyp measuring 5 mm in size was found in the sigmoid colon; polypectomy was performed with a cold snare 3.   Moderate diverticulosis was noted in the sigmoid colon 4.   There was diverticulosis noted in the descending colon and transverse colon 5.   Hemorrhoids  RECOMMENDATIONS: If the polyp(s) removed today are proven to be adenomatous (pre-cancerous) polyps, you will need a repeat colonoscopy in 5 years.  Otherwise you should continue to follow colorectal cancer screening guidelines for "routine risk" patients with colonoscopy in 10 years.  You will receive a letter within 1-2 weeks with the results of your biopsy as well as final recommendations.  Please call my office if you have not received a letter after 3 weeks.   eSigned:  Louis Meckel, MD 03/27/2013 9:16 AM   cc:   PATIENT NAME:  Monica, Michael MR#: [id number]

## 2013-03-27 NOTE — Patient Instructions (Signed)
YOU HAD AN ENDOSCOPIC PROCEDURE TODAY AT THE [facility]: Refer to the procedure report that was given to you for any specific questions about what was found during the examination.  If the procedure report does not answer your questions, please call your gastroenterologist to clarify.  If you requested that your care partner not be given the details of your procedure findings, then the procedure report has been included in a sealed envelope for you to review at your convenience later.  YOU SHOULD EXPECT: Some feelings of bloating in the abdomen. Passage of more gas than usual.  Walking can help get rid of the air that was put into your GI tract during the procedure and reduce the bloating. If you had a lower endoscopy (such as a colonoscopy or flexible sigmoidoscopy) you may notice spotting of blood in your stool or on the toilet paper. If you underwent a bowel prep for your procedure, then you may not have a normal bowel movement for a few days.  DIET: Your first meal following the procedure should be a light meal and then it is ok to progress to your normal diet.  A half-sandwich or bowl of soup is an example of a good first meal.  Heavy or fried foods are harder to digest and may make you feel nauseous or bloated.  Likewise meals heavy in dairy and vegetables can cause extra gas to form and this can also increase the bloating.  Drink plenty of fluids but you should avoid alcoholic beverages for 24 hours.  ACTIVITY: Your care partner should take you home directly after the procedure.  You should plan to take it easy, moving slowly for the rest of the day.  You can resume normal activity the day after the procedure however you should NOT DRIVE or use heavy machinery for 24 hours (because of the sedation medicines used during the test).    SYMPTOMS TO REPORT IMMEDIATELY: A gastroenterologist can be reached at any hour.  During normal business hours, 8:30 AM to 5:00 PM Monday through Friday,  call [phone].  After hours and on weekends, please call the GI answering service at [phone] who will take a message and have the physician on call contact you.   Following lower endoscopy (colonoscopy or flexible sigmoidoscopy):  Excessive amounts of blood in the stool  Significant tenderness or worsening of abdominal pains  Swelling of the abdomen that is new, acute  Fever of 100F or higher    FOLLOW UP: If any biopsies were taken you will be contacted by phone or by letter within the next 1-3 weeks.  Call your gastroenterologist if you have not heard about the biopsies in 3 weeks.  Our staff will call the home number listed on your records the next business day following your procedure to check on you and address any questions or concerns that you may have at that time regarding the information given to you following your procedure. This is a courtesy call and so if there is no answer at the home number and we have not heard from you through the emergency physician on call, we will assume that you have returned to your regular daily activities without incident.  SIGNATURES/CONFIDENTIALITY: You and/or your care partner have signed paperwork which will be entered into your electronic medical record.  These signatures attest to the fact that that the information above on your After Visit Summary has been reviewed and is understood.  Full responsibility of the confidentiality   of this discharge information lies with you and/or your care-partner.     

## 2013-03-27 NOTE — Progress Notes (Signed)
Called to room to assist during endoscopic procedure.  Patient ID and intended procedure confirmed with present staff. Received instructions for my participation in the procedure from the performing physician.  

## 2013-03-27 NOTE — Progress Notes (Signed)
Patient did not experience any of the following events: a burn prior to discharge; a fall within the facility; wrong site/side/patient/procedure/implant event; or a hospital transfer or hospital admission upon discharge from the facility. (G8907) Patient did not have preoperative order for IV antibiotic SSI prophylaxis. (G8918)  

## 2013-03-28 ENCOUNTER — Telehealth: Payer: Self-pay | Admitting: *Deleted

## 2013-03-28 NOTE — Telephone Encounter (Signed)
  Follow up Call-  Call back number 03/27/2013  Post procedure Call Back phone  # [phone]  Permission to leave phone message Yes     Patient questions:  Do you have a fever, pain , or abdominal swelling? no Pain Score  0 *  Have you tolerated food without any problems? yes  Have you been able to return to your normal activities? yes  Do you have any questions about your discharge instructions: Diet   no Medications  no Follow up visit  no  Do you have questions or concerns about your Care? no  Actions: * If pain score is 4 or above: No action needed, pain <4.

## 2013-03-30 NOTE — ED Notes (Signed)
UA culture final report, no growth

## 2013-04-02 ENCOUNTER — Telehealth: Payer: Self-pay | Admitting: *Deleted

## 2013-04-02 NOTE — Telephone Encounter (Signed)
[facility] Triage Call Report Triage Record Num: [id number] Operator: Patriciaann Clan Patient Name: Monica Michael Call Date & Time: 03/25/2013 12:23:20PM Patient Phone: [phone] PCP: Illene Regulus Patient Gender: Female PCP Fax : [phone] Patient DOB: 10/06/1960 Practice Name: [facility] Reason for Call: Caller: Jordan/Patient; PCP: Illene Regulus (Adults only); CB#: [phone]; Call regarding Urinary Pain/Bleeding; Patient states she developed urinary urgency, frequency, hematuria. Onset 03/25/13. Afebrile. Denies flank or abdominal pain. Triage per Bloody Urine Protocol. Ne emergent sx identified. Disposition of " See Provider within 4 hours" obtained related to positive triage assessment for " Urinary tract sx and any flank, low back, lower abdominal or genital area pain." Care advice given per guidelines. Patient advised to be seen at [facility] now. Patient verbalizes understanding and agreeable. Protocol(s) Used: Bloody Urine Protocol(s) Used: Urinary Symptoms - Female Recommended Outcome per Protocol: See Provider within 4 hours Reason for Outcome: Blood in urine Urinary tract symptoms AND any flank, low back, lower abdominal or genital area (labia, vagina OR testicle/scrotum) pain Care Advice: ~ Another adult should drive. ~ IMMEDIATE ACTION 10/

## 2013-04-04 ENCOUNTER — Encounter: Payer: Self-pay | Admitting: Gastroenterology

## 2013-04-05 ENCOUNTER — Other Ambulatory Visit: Payer: Self-pay

## 2013-06-18 ENCOUNTER — Encounter: Payer: Self-pay | Admitting: Cardiovascular Disease

## 2013-06-18 ENCOUNTER — Ambulatory Visit (HOSPITAL_COMMUNITY): Payer: 59 | Attending: Cardiovascular Disease

## 2013-06-18 DIAGNOSIS — E119 Type 2 diabetes mellitus without complications: Secondary | ICD-10-CM | POA: Insufficient documentation

## 2013-06-18 DIAGNOSIS — I2510 Atherosclerotic heart disease of native coronary artery without angina pectoris: Secondary | ICD-10-CM | POA: Insufficient documentation

## 2013-06-18 DIAGNOSIS — F17200 Nicotine dependence, unspecified, uncomplicated: Secondary | ICD-10-CM | POA: Insufficient documentation

## 2013-06-18 DIAGNOSIS — I6529 Occlusion and stenosis of unspecified carotid artery: Secondary | ICD-10-CM | POA: Insufficient documentation

## 2013-06-18 DIAGNOSIS — I658 Occlusion and stenosis of other precerebral arteries: Secondary | ICD-10-CM | POA: Insufficient documentation

## 2013-06-18 DIAGNOSIS — E785 Hyperlipidemia, unspecified: Secondary | ICD-10-CM | POA: Insufficient documentation

## 2013-09-23 ENCOUNTER — Encounter: Payer: Self-pay | Admitting: Cardiology

## 2013-09-23 DIAGNOSIS — R946 Abnormal results of thyroid function studies: Secondary | ICD-10-CM | POA: Insufficient documentation

## 2013-09-23 NOTE — Progress Notes (Signed)
Patient ID: Monica Michael, female   DOB: 08/12/1960, 53 y.o.   MRN: [id number] Carotid Doppler January, 2015 revealed mild carotid disease. There were sonolucent areas seen in the thyroid. Thyroid ultrasound will be ordered. I will try to coordinate this data with the patient's new primary care physician when I can determine who this is.  Daryel November, MD

## 2013-09-24 ENCOUNTER — Telehealth: Payer: Self-pay

## 2013-09-24 DIAGNOSIS — I779 Disorder of arteries and arterioles, unspecified: Secondary | ICD-10-CM

## 2013-09-24 NOTE — Telephone Encounter (Signed)
**Note De-Identified Monica Michael Obfuscation** LMTCB.  Please let the patient know that the carotids looked good on the Doppler in January, 2015. There are some areas in the thyroid that need to be assessed further. Please explain to the patient that this is not necessarily a problem. However please arrange a carotid ultrasound for further assessment. On the request, please state that sonolucent areas were seen in both lobes of the thyroid on a carotid Doppler.    ----- Message -----    From: Audree Bane    Sent: 09/19/2013 12:09 PM    To: Carlena Bjornstad, MD        Doesn't look like these results ever were routed to you.

## 2013-09-25 NOTE — Telephone Encounter (Signed)
The pt is advised and she agrees to have Carotid Duplex. I have ordered the Carotid Duplex and sent a message to [facility] to arrange.

## 2013-09-25 NOTE — Telephone Encounter (Signed)
LMTCB

## 2013-09-25 NOTE — Telephone Encounter (Signed)
Patient is returning your call. Please call back.  °

## 2013-09-27 ENCOUNTER — Other Ambulatory Visit: Payer: Self-pay

## 2013-09-27 DIAGNOSIS — R9430 Abnormal result of cardiovascular function study, unspecified: Secondary | ICD-10-CM

## 2013-10-01 ENCOUNTER — Encounter (HOSPITAL_COMMUNITY): Payer: 59

## 2013-10-02 ENCOUNTER — Ambulatory Visit (HOSPITAL_COMMUNITY): Admission: RE | Admit: 2013-10-02 | Payer: 59 | Source: Ambulatory Visit | Admitting: Cardiology

## 2013-10-02 ENCOUNTER — Telehealth: Payer: Self-pay

## 2013-10-02 ENCOUNTER — Ambulatory Visit (HOSPITAL_COMMUNITY): Payer: 59

## 2013-10-02 ENCOUNTER — Encounter (HOSPITAL_COMMUNITY): Admission: RE | Payer: Self-pay | Source: Ambulatory Visit

## 2013-10-02 SURGERY — LEFT HEART CATHETERIZATION WITH CORONARY ANGIOGRAM
Anesthesia: LOCAL

## 2013-10-02 NOTE — Telephone Encounter (Signed)
**Note De-Identified Ryker Sudbury Obfuscation** I left a detailed message on the pts VM expressing my apology and stating that we did not receive her Carotid Duplex results from January until recently and that we contacted her with Dr Kae Heller recommendations as soon as we became aware of her results. I asked her to call back if she has any questions or concerns.

## 2013-10-02 NOTE — Telephone Encounter (Signed)
**Note De-Identified Rosibel Giacobbe Obfuscation** Message copied by Dennie Fetters on Tue Oct 02, 2013  8:43 AM ------      Message from: Vashti Hey D      Created: Fri Sep 28, 2013 10:52 AM      Regarding: FW: Thyroid US       09/28/13 Appointment on 10/02/13,Lynn please give this patient a call DZ:HGDJ took so long after her test to schedule her u/s.      Thanks---:)      ----- Message -----         From: Philbert Riser         Sent: 09/28/2013   9:18 AM           To: Philbert Riser      Subject: FW: Thyroid US                                                       ----- Message -----         From: Renaldo Fiddler, LPN         Sent: 2/42/6834   3:54 PM           To: [facility]      Subject: Thyroid US                                               Per Dr [name] this pt needs to have a thyroid US due to abnormal findings on a carotid duplex. The pt is aware. Thanks, Jeani Hawking       ------

## 2013-10-05 ENCOUNTER — Ambulatory Visit (HOSPITAL_COMMUNITY)
Admission: RE | Admit: 2013-10-05 | Discharge: 2013-10-05 | Disposition: A | Discharge status: Home / Self Care | Payer: 59 | Source: Ambulatory Visit | Attending: Cardiology | Admitting: Cardiology

## 2013-10-05 DIAGNOSIS — R9430 Abnormal result of cardiovascular function study, unspecified: Secondary | ICD-10-CM

## 2013-10-05 DIAGNOSIS — E041 Nontoxic single thyroid nodule: Secondary | ICD-10-CM | POA: Insufficient documentation

## 2013-10-09 ENCOUNTER — Ambulatory Visit (INDEPENDENT_AMBULATORY_CARE_PROVIDER_SITE_OTHER)
Admission: RE | Admit: 2013-10-09 | Discharge: 2013-10-09 | Disposition: A | Discharge status: Home / Self Care | Payer: 59 | Source: Ambulatory Visit | Attending: Internal Medicine | Admitting: Internal Medicine

## 2013-10-09 ENCOUNTER — Ambulatory Visit (INDEPENDENT_AMBULATORY_CARE_PROVIDER_SITE_OTHER): Payer: 59 | Admitting: Internal Medicine

## 2013-10-09 ENCOUNTER — Other Ambulatory Visit (INDEPENDENT_AMBULATORY_CARE_PROVIDER_SITE_OTHER): Payer: 59

## 2013-10-09 ENCOUNTER — Encounter: Payer: Self-pay | Admitting: Internal Medicine

## 2013-10-09 VITALS — BP 112/78 | HR 73 | Temp 98.5°F | Wt 158.0 lb

## 2013-10-09 DIAGNOSIS — E041 Nontoxic single thyroid nodule: Secondary | ICD-10-CM

## 2013-10-09 DIAGNOSIS — E1165 Type 2 diabetes mellitus with hyperglycemia: Principal | ICD-10-CM

## 2013-10-09 DIAGNOSIS — R634 Abnormal weight loss: Secondary | ICD-10-CM

## 2013-10-09 DIAGNOSIS — IMO0001 Reserved for inherently not codable concepts without codable children: Secondary | ICD-10-CM

## 2013-10-09 DIAGNOSIS — Z0001 Encounter for general adult medical examination with abnormal findings: Secondary | ICD-10-CM | POA: Insufficient documentation

## 2013-10-09 DIAGNOSIS — F17200 Nicotine dependence, unspecified, uncomplicated: Secondary | ICD-10-CM

## 2013-10-09 DIAGNOSIS — Z720 Tobacco use: Secondary | ICD-10-CM

## 2013-10-09 LAB — MICROALBUMIN / CREATININE URINE RATIO
Creatinine,U: 98.3 mg/dL
Microalb Creat Ratio: 6.9 mg/g (ref 0.0–30.0)
Microalb, Ur: 6.8 mg/dL — ABNORMAL HIGH (ref 0.0–1.9)

## 2013-10-09 LAB — CBC WITH DIFFERENTIAL/PLATELET
BASOS PCT: 0.9 % (ref 0.0–3.0)
Basophils Absolute: 0.1 10*3/uL (ref 0.0–0.1)
EOS PCT: 4.1 % (ref 0.0–5.0)
Eosinophils Absolute: 0.4 10*3/uL (ref 0.0–0.7)
HEMATOCRIT: 44 % (ref 36.0–46.0)
Hemoglobin: 14.7 g/dL (ref 12.0–15.0)
Lymphocytes Relative: 33.1 % (ref 12.0–46.0)
Lymphs Abs: 3.1 10*3/uL (ref 0.7–4.0)
MCHC: 33.4 g/dL (ref 30.0–36.0)
MCV: 85.5 fl (ref 78.0–100.0)
MONO ABS: 0.9 10*3/uL (ref 0.1–1.0)
Monocytes Relative: 9.8 % (ref 3.0–12.0)
Neutro Abs: 4.9 10*3/uL (ref 1.4–7.7)
Neutrophils Relative %: 52.1 % (ref 43.0–77.0)
Platelets: 283 10*3/uL (ref 150.0–400.0)
RBC: 5.15 Mil/uL — AB (ref 3.87–5.11)
RDW: 16.3 % — ABNORMAL HIGH (ref 11.5–15.5)
WBC: 9.4 10*3/uL (ref 4.0–10.5)

## 2013-10-09 LAB — LIPID PANEL
CHOLESTEROL: 113 mg/dL (ref 0–200)
HDL: 37.1 mg/dL — ABNORMAL LOW (ref >39.00)
LDL CALC: 50 mg/dL (ref 0–99)
Total CHOL/HDL Ratio: 3
Triglycerides: 132 mg/dL (ref 0.0–149.0)
VLDL: 26.4 mg/dL (ref 0.0–40.0)

## 2013-10-09 LAB — BASIC METABOLIC PANEL
BUN: 12 mg/dL (ref 6–23)
CALCIUM: 8.9 mg/dL (ref 8.4–10.5)
CO2: 29 mEq/L (ref 19–32)
Chloride: 103 mEq/L (ref 96–112)
Creatinine, Ser: 0.9 mg/dL (ref 0.4–1.2)
GFR: 65.93 mL/min (ref >60.00)
Glucose, Bld: 73 mg/dL (ref 70–99)
Potassium: 3.8 mEq/L (ref 3.5–5.1)
Sodium: 138 mEq/L (ref 135–145)

## 2013-10-09 LAB — URINALYSIS, ROUTINE W REFLEX MICROSCOPIC
Bilirubin Urine: NEGATIVE
Hgb urine dipstick: NEGATIVE
Ketones, ur: NEGATIVE
Leukocytes, UA: NEGATIVE
Nitrite: NEGATIVE
SPECIFIC GRAVITY, URINE: 1.02 (ref 1.000–1.030)
Urobilinogen, UA: 0.2 (ref 0.0–1.0)
pH: 5.5 (ref 5.0–8.0)

## 2013-10-09 LAB — TSH: TSH: 1.36 u[IU]/mL (ref 0.35–4.50)

## 2013-10-09 LAB — HEPATIC FUNCTION PANEL
ALT: 18 U/L (ref 0–35)
AST: 19 U/L (ref 0–37)
Albumin: 3.9 g/dL (ref 3.5–5.2)
Alkaline Phosphatase: 74 U/L (ref 39–117)
BILIRUBIN TOTAL: 0.5 mg/dL (ref 0.2–1.2)
Bilirubin, Direct: 0.1 mg/dL (ref 0.0–0.3)
Total Protein: 7.5 g/dL (ref 6.0–8.3)

## 2013-10-09 LAB — HEMOGLOBIN A1C: Hgb A1c MFr Bld: 6.5 % (ref 4.6–6.5)

## 2013-10-09 LAB — T4, FREE: Free T4: 0.84 ng/dL (ref 0.60–1.60)

## 2013-10-09 NOTE — Assessment & Plan Note (Addendum)
15 lbs in 6 mo - ? Etiology, for free t4 as well, also cxr with hx of smoking

## 2013-10-09 NOTE — Assessment & Plan Note (Signed)
Urged to quit 

## 2013-10-09 NOTE — Patient Instructions (Addendum)
Please continue all other medications as before, and refills have been done if requested. Please have the pharmacy call with any other refills you may need.  Please continue your efforts at being more active, low cholesterol diet, and weight control.  You are otherwise up to date with prevention measures today.  You will be contacted regarding the referral for: thyroid biopsy  Please go to the XRAY Department in the Basement (go straight as you get off the elevator) for the x-ray testing  Please go to the LAB in the Basement (turn left off the elevator) for the tests to be done today  You will be contacted by phone if any changes need to be made immediately.  Otherwise, you will receive a letter about your results with an explanation, but please check with MyChart first.  Please remember to sign up for MyChart if you have not done so, as this will be important to you in the future with finding out test results, communicating by private email, and scheduling acute appointments online when needed.  Please return in 6 months, or sooner if needed

## 2013-10-09 NOTE — Progress Notes (Signed)
Subjective:    Patient ID: Monica Michael, female    DOB: Nov 19, 1960, 53 y.o.   MRN: [id number]  HPI    Here to f/u; overall doing ok,  Pt denies chest pain, increased sob or doe, wheezing, orthopnea, PND, increased LE swelling, palpitations, dizziness or syncope.  Pt denies polydipsia, polyuria, or low sugar symptoms such as weakness or confusion improved with po intake.  Pt denies new neurological symptoms such as new headache, or facial or extremity weakness or numbness.   Pt states overall good compliance with meds, has been trying to follow lower cholesterol, diabetic diet, with wt overall improved approx 15 lbs x 6 mo.  Had recent abnormal thyroid u/s with nodule > 1 cm. Denies hyper or hypo thyroid symptoms such as voice, skin or hair change. Past Medical History  Diagnosis Date  . Hyperlipidemia   . Ejection fraction     EF 60%, catheterization, February, 2013  . Paget's disease of bone     Noted on chest CT scan  . Type II or unspecified type diabetes mellitus without mention of complication, uncontrolled   . Other disorders of bone and cartilage(733.99)   . CAD in native artery Jan '13    NSTEMI 07/2011: LHC 07/03/11: Proximal LAD 50-70%, circumflex 95% mid, ostial RCA 50% EF 60%.  PCI: Resolute DES to the mid circumflex  . HTN (hypertension)   . Tobacco abuse   . Bronchitis     February, 2013  . Carotid artery disease     Doppler, December, 2013, 0-09% R. ICA, 38-18% LICA  . Myocardial infarction     07/2011  . UTI (urinary tract infection) 03/25/13   Past Surgical History  Procedure Laterality Date  . Abdominal hysterectomy  2001  . Coronary angioplasty with stent placement  07/2011    1 stent    reports that she has been smoking Cigarettes.  She has a 10 pack-year smoking history. She has never used smokeless tobacco. She reports that she does not drink alcohol or use illicit drugs. family history includes Coronary artery disease in her father; Diabetes in her father;  Heart failure (age of onset: 77) in her father; Hodgkin's lymphoma (age of onset: 18) in her mother; Hyperlipidemia in her father; Hypertension in her father; Kidney failure in her father. There is no history of Cancer, Colon cancer, Rectal cancer, Stomach cancer, or Esophageal cancer. No Known Allergies Current Outpatient Prescriptions on File Prior to Visit  Medication Sig Dispense Refill  . aspirin 81 MG tablet Take 81 mg by mouth daily.      Marland Kitchen buPROPion (WELLBUTRIN SR) 150 MG 12 hr tablet Take 1 tablet (150 mg total) by mouth 2 (two) times daily.  60 tablet  5  . Canagliflozin (INVOKANA PO) Take by mouth daily.      . carvedilol (COREG) 6.25 MG tablet 6.25 mg. TAKE 1/2 in am and 1/2 in pm      . glimepiride (AMARYL) 2 MG tablet Take 1 tablet (2 mg total) by mouth daily before breakfast.  90 tablet  3  . rosuvastatin (CRESTOR) 40 MG tablet Take 1 tablet (40 mg total) by mouth daily.  30 tablet  0  . Wheat Dextrin (BENEFIBER PO) Take by mouth. Take 6 tsp daily      . albuterol (PROAIR HFA) 108 (90 BASE) MCG/ACT inhaler Inhale 2 puffs into the lungs every 6 (six) hours as needed for wheezing.  1 Inhaler  0  . nitroGLYCERIN (NITROSTAT) 0.4  MG SL tablet Place 0.4 mg under the tongue every 5 (five) minutes x 3 doses as needed for chest pain (up to 3 doses).      . [DISCONTINUED] lisinopril (PRINIVIL,ZESTRIL) 10 MG tablet Take 0.5 tablets (5 mg total) by mouth daily.  30 tablet  6   No current facility-administered medications on file prior to visit.   Review of Systems  Constitutional: Negative for unusual diaphoresis or other sweats  HENT: Negative for ringing in ear Eyes: Negative for double vision or worsening visual disturbance.  Respiratory: Negative for choking and stridor.   Gastrointestinal: Negative for vomiting or other signifcant bowel change Genitourinary: Negative for hematuria or decreased urine volume.  Musculoskeletal: Negative for other MSK pain or swelling Skin: Negative for  color change and worsening wound.  Neurological: Negative for tremors and numbness other than noted  Psychiatric/Behavioral: Negative for decreased concentration or agitation other than above  '    Objective:   Physical Exam BP 112/78  Pulse 73  Temp(Src) 98.5 F (36.9 C) (Oral)  Wt 158 lb (71.668 kg)  SpO2 95% VS noted,  Constitutional: Pt appears well-developed, well-nourished.  HENT: Head: NCAT.  Right Ear: External ear normal.  Left Ear: External ear normal.  Eyes: . Pupils are equal, round, and reactive to light. Conjunctivae and EOM are normal Neck: Normal range of motion. Neck supple. Unable to appreicate thyroid nodule Cardiovascular: Normal rate and regular rhythm.   Pulmonary/Chest: Effort normal and breath sounds normal.  Neurological: Pt is alert. Not confused , motor grossly intact Skin: Skin is warm. No rash Psychiatric: Pt behavior is normal. No agitation.     Assessment & Plan:

## 2013-10-09 NOTE — Assessment & Plan Note (Signed)
stable overall by history and exam, recent data reviewed with pt, and pt to continue medical treatment as before,  to f/u any worsening symptoms or concerns Lab Results  Component Value Date   HGBA1C 6.5 10/09/2013

## 2013-10-09 NOTE — Assessment & Plan Note (Signed)
For thyroid u/s directed biopsy

## 2013-10-09 NOTE — Progress Notes (Signed)
Pre visit review using our clinic review tool, if applicable. No additional management support is needed unless otherwise documented below in the visit note. 

## 2013-10-10 ENCOUNTER — Encounter: Payer: Self-pay | Admitting: Internal Medicine

## 2013-10-11 NOTE — Telephone Encounter (Signed)
Monica Michael to see above

## 2013-10-16 ENCOUNTER — Ambulatory Visit
Admission: RE | Admit: 2013-10-16 | Discharge: 2013-10-16 | Disposition: A | Discharge status: Home / Self Care | Payer: 59 | Source: Ambulatory Visit | Attending: Internal Medicine | Admitting: Internal Medicine

## 2013-10-16 ENCOUNTER — Other Ambulatory Visit (HOSPITAL_COMMUNITY)
Admission: RE | Admit: 2013-10-16 | Discharge: 2013-10-16 | Disposition: A | Discharge status: Home / Self Care | Payer: 59 | Source: Ambulatory Visit | Attending: Interventional Radiology | Admitting: Interventional Radiology

## 2013-10-16 DIAGNOSIS — E041 Nontoxic single thyroid nodule: Secondary | ICD-10-CM

## 2013-10-28 ENCOUNTER — Encounter: Payer: Self-pay | Admitting: Cardiology

## 2013-10-29 ENCOUNTER — Encounter: Payer: Self-pay | Admitting: Cardiology

## 2013-10-29 ENCOUNTER — Ambulatory Visit (INDEPENDENT_AMBULATORY_CARE_PROVIDER_SITE_OTHER): Payer: 59 | Admitting: Cardiology

## 2013-10-29 VITALS — BP 111/64 | HR 68 | Ht 63.0 in | Wt 158.0 lb

## 2013-10-29 DIAGNOSIS — F17200 Nicotine dependence, unspecified, uncomplicated: Secondary | ICD-10-CM

## 2013-10-29 DIAGNOSIS — E782 Mixed hyperlipidemia: Secondary | ICD-10-CM

## 2013-10-29 DIAGNOSIS — I739 Peripheral vascular disease, unspecified: Secondary | ICD-10-CM

## 2013-10-29 DIAGNOSIS — I779 Disorder of arteries and arterioles, unspecified: Secondary | ICD-10-CM

## 2013-10-29 DIAGNOSIS — E041 Nontoxic single thyroid nodule: Secondary | ICD-10-CM

## 2013-10-29 DIAGNOSIS — I10 Essential (primary) hypertension: Secondary | ICD-10-CM

## 2013-10-29 DIAGNOSIS — Z720 Tobacco use: Secondary | ICD-10-CM

## 2013-10-29 DIAGNOSIS — I2510 Atherosclerotic heart disease of native coronary artery without angina pectoris: Secondary | ICD-10-CM

## 2013-10-29 NOTE — Patient Instructions (Signed)
**Note De-identified Haydyn Girvan Obfuscation** Your physician recommends that you continue on your current medications as directed. Please refer to the Current Medication list given to you today.  Your physician discussed the hazards of tobacco use. Tobacco use cessation is recommended and techniques and options to help you quit were discussed.   Your physician wants you to follow-up in: 1 year You will receive a reminder letter in the mail two months in advance. If you don't receive a letter, please call our office to schedule the follow-up appointment.  

## 2013-10-29 NOTE — Assessment & Plan Note (Signed)
Unfortunately the patient continues to smoke. She is working on decreasing through a plan at her workplace

## 2013-10-29 NOTE — Assessment & Plan Note (Signed)
At the time of her last thyroid study there was a note that she had thyroid nodule. We communicated with her primary physician and a thyroid ultrasound was done. This showed a nodule and she had a biopsy. Fortunately this was non-neoplastic. She's doing very well.  As part of today's evaluation I spent greater than 25 minutes with her total care. More than half of this time was with talking about her coronary disease and counseling her to stop smoking and talking about her thyroid status.

## 2013-10-29 NOTE — Assessment & Plan Note (Signed)
Carotid disease is stable. Her last Doppler was in January, 2015.

## 2013-10-29 NOTE — Assessment & Plan Note (Signed)
Hypertension is controlled. No change in therapy. 

## 2013-10-29 NOTE — Progress Notes (Signed)
Patient ID: Monica Michael, female   DOB: 19-Jul-1960, 53 y.o.   MRN: [id number]    HPI  Patient is seen today for one-year followup of her coronary disease. She is actually doing well. She has not had any recurrent chest pain. Unfortunately she continues to smoke. She says she is working on this at a plan at her work place. No Known Allergies  Current Outpatient Prescriptions  Medication Sig Dispense Refill  . albuterol (PROVENTIL HFA;VENTOLIN HFA) 108 (90 BASE) MCG/ACT inhaler Inhale into the lungs every 6 (six) hours as needed for wheezing or shortness of breath.      Marland Kitchen aspirin 81 MG tablet Take 81 mg by mouth daily.      . Canagliflozin (INVOKANA PO) Take by mouth daily.      . carvedilol (COREG) 6.25 MG tablet 6.25 mg. TAKE 1/2 in am and 1/2 in pm      . glimepiride (AMARYL) 2 MG tablet Take 1 tablet (2 mg total) by mouth daily before breakfast.  90 tablet  3  . nitroGLYCERIN (NITROSTAT) 0.4 MG SL tablet Place 0.4 mg under the tongue every 5 (five) minutes as needed for chest pain.      . rosuvastatin (CRESTOR) 40 MG tablet Take 40 mg by mouth daily.      . Wheat Dextrin (BENEFIBER PO) Take by mouth. Take 6 tsp daily      . buPROPion (WELLBUTRIN SR) 150 MG 12 hr tablet HAVE NOT STARTED YET      . [DISCONTINUED] lisinopril (PRINIVIL,ZESTRIL) 10 MG tablet Take 0.5 tablets (5 mg total) by mouth daily.  30 tablet  6   No current facility-administered medications for this visit.    History   Social History  . Marital Status: Widowed    Spouse Name: N/A    Number of Children: 1  . Years of Education: 16   Occupational History  .  [place] History Main Topics  . Smoking status: Current Some Day Smoker -- 0.50 packs/day for 20 years    Types: Cigarettes  . Smokeless tobacco: Never Used     Comment: discussed cessation strategy  . Alcohol Use: No  . Drug Use: No  . Sexual Activity: Not Currently   Other Topics Concern  . Not on file   Social History  Narrative   [employer]. Married '84-14 yrs; divorced; married '06 - 5 yers/widowed. 1 son - '89. Work-quality reviewer in an office setting.    Family History  Problem Relation Age of Onset  . Heart failure Father 89    deceased  . Diabetes Father   . Kidney failure Father   . Hypertension Father   . Hyperlipidemia Father   . Coronary artery disease Father   . Hodgkin's lymphoma Mother 62    deceased  . Cancer Neg Hx     breast, colon  . Colon cancer Neg Hx   . Rectal cancer Neg Hx   . Stomach cancer Neg Hx   . Esophageal cancer Neg Hx     Past Medical History  Diagnosis Date  . Hyperlipidemia   . Ejection fraction     EF 60%, catheterization, February, 2013  . Paget's disease of bone     Noted on chest CT scan  . Type II or unspecified type diabetes mellitus without mention of complication, uncontrolled   . Other disorders of bone and cartilage(733.99)   . CAD in native artery Jan '13    NSTEMI  07/2011: LHC 07/03/11: Proximal LAD 50-70%, circumflex 95% mid, ostial RCA 50% EF 60%.  PCI: Resolute DES to the mid circumflex  . HTN (hypertension)   . Tobacco abuse   . Bronchitis     February, 2013  . Carotid artery disease     Doppler, December, 2013, 6-60% R. ICA, 63-01% LICA  . Myocardial infarction     07/2011  . UTI (urinary tract infection) 03/25/13    Past Surgical History  Procedure Laterality Date  . Abdominal hysterectomy  2001  . Coronary angioplasty with stent placement  07/2011    1 stent    Patient Active Problem List   Diagnosis Date Noted  . Loss of weight 10/09/2013  . Preventative health care 10/09/2013  . Thyroid nodule 10/09/2013  . Nonspecific abnormal results of thyroid function study 09/23/2013  . Carotid artery disease   . HTN (hypertension) 07/23/2011  . CAD in native artery   . Tobacco abuse 07/04/2011  . Paget's disease of bone   . DIAB W/O MENTION COMP TYPE II/UNS TYPE UNCNTRL 02/13/2009  . Mixed hyperlipidemia 02/13/2009  . OTHER  DISORDERS OF BONE AND CARTILAGE OTHER 02/13/2009  . PSORIASIS 06/27/2007  . SHOULDER PAIN, RIGHT 06/27/2007    ROS   Patient denies fever, chills, headache, sweats, rash, change in vision, change in hearing, chest pain, cough, nausea or vomiting, urinary symptoms. All other systems are reviewed and are negative.  PHYSICAL EXAM  Patient is stable. She is oriented to person time and place. Affect is normal. Head is atraumatic. Sclera and conjunctiva are normal. There is no jugulovenous distention. Lungs are clear. Respiratory effort is nonlabored. Cardiac exam reveals S1 and S2. Abdomen is soft. There is no peripheral edema. There are no musculoskeletal deformities. There are no skin rashes.  Filed Vitals:   10/29/13 0820  BP: 111/64  Pulse: 68  Height: 5\' 3"  (1.6 m)  Weight: 158 lb (71.668 kg)    EKG  EKG is done today and reviewed by me. The EKG is normal. There is no change from the past.  ASSESSMENT & PLAN

## 2013-10-29 NOTE — Assessment & Plan Note (Signed)
Patient is on guideline directed therapy with a statin.

## 2013-10-29 NOTE — Assessment & Plan Note (Signed)
Coronary disease is stable. She does not need any testing at this time.

## 2013-11-15 ENCOUNTER — Other Ambulatory Visit: Payer: Self-pay | Admitting: Cardiology

## 2013-12-17 ENCOUNTER — Telehealth: Payer: Self-pay | Admitting: Internal Medicine

## 2013-12-17 ENCOUNTER — Other Ambulatory Visit: Payer: Self-pay | Admitting: Internal Medicine

## 2013-12-17 MED ORDER — CARVEDILOL 6.25 MG PO TABS: ORAL_TABLET | ORAL | Status: DC

## 2013-12-17 MED ORDER — GLIMEPIRIDE 2 MG PO TABS: 2.0000 mg | ORAL_TABLET | Freq: Every day | ORAL | Status: DC

## 2013-12-17 MED ORDER — ROSUVASTATIN CALCIUM 40 MG PO TABS: 40.0000 mg | ORAL_TABLET | Freq: Every day | ORAL | Status: DC

## 2013-12-17 NOTE — Telephone Encounter (Signed)
Patient needs refill on Glimepiride and Invokana sent to [facility] on [address].  [facility] would not refill.  Said she had to call office.  Also, she asks that her meds be reviewed for any other refills that may be needed.

## 2013-12-17 NOTE — Telephone Encounter (Signed)
Ok with me, 3 mo each, needs new PCP

## 2013-12-17 NOTE — Telephone Encounter (Signed)
Refill medications requested as well as others due for refill.  Called the patient left a message that refills have been taken care of.

## 2014-01-01 ENCOUNTER — Other Ambulatory Visit: Payer: Self-pay | Admitting: Obstetrics and Gynecology

## 2014-01-02 LAB — CYTOLOGY - PAP

## 2014-02-12 ENCOUNTER — Other Ambulatory Visit: Payer: Self-pay

## 2014-02-12 MED ORDER — GLIMEPIRIDE 2 MG PO TABS: 2.0000 mg | ORAL_TABLET | Freq: Every day | ORAL | Status: DC

## 2014-02-13 ENCOUNTER — Other Ambulatory Visit: Payer: Self-pay | Admitting: Cardiology

## 2014-02-25 ENCOUNTER — Encounter: Payer: Self-pay | Admitting: Internal Medicine

## 2014-02-26 NOTE — Telephone Encounter (Signed)
To offer pt an appt, thanks

## 2014-02-28 ENCOUNTER — Encounter: Payer: Self-pay | Admitting: Internal Medicine

## 2014-02-28 ENCOUNTER — Ambulatory Visit (INDEPENDENT_AMBULATORY_CARE_PROVIDER_SITE_OTHER): Payer: 59 | Admitting: Internal Medicine

## 2014-02-28 VITALS — BP 122/70 | HR 61 | Temp 98.5°F | Resp 14 | Wt 152.0 lb

## 2014-02-28 DIAGNOSIS — J309 Allergic rhinitis, unspecified: Secondary | ICD-10-CM

## 2014-02-28 DIAGNOSIS — R05 Cough: Secondary | ICD-10-CM

## 2014-02-28 DIAGNOSIS — H1010 Acute atopic conjunctivitis, unspecified eye: Secondary | ICD-10-CM

## 2014-02-28 DIAGNOSIS — R059 Cough, unspecified: Secondary | ICD-10-CM

## 2014-02-28 MED ORDER — MONTELUKAST SODIUM 10 MG PO TABS: 10.0000 mg | ORAL_TABLET | Freq: Every day | ORAL | Status: DC

## 2014-02-28 NOTE — Progress Notes (Signed)
   Subjective:    Patient ID: Monica Michael, female    DOB: [birth year], 53 y.o.   MRN: [id number]  HPI   Symptoms began 9/28 as rhinitis, head congestion,&  chest congestion. She initially had cough with some yellow sputum. She also associated extrinsic symptoms of itchy, watery eyes, & sneezing.  She believes that there were some fever;  chills and sweating  She had been exposed to sick associates  & family.  She also describes  frontal and facial sinus discomfort. Those have  resolved   At this time the major symptoms are some head congestion associated with extrinsic symptoms as well as a nonproductive cough. She feels that she has been wheezing.  She smokes half a pack per day but has set a quit date for 10/5   Review of Systems  . No frontal headache, facial pain, or nasal purulence at this time.  No fever since 9/28.      Objective:   Physical Exam  Possible pertinent findings include: Face slightly weathered. Breath sounds are somewhat decreased. She does not have wheezing, rhonchi, rales.  General appearance:good health ;well nourished; no acute distress or increased work of breathing is present.  No  lymphadenopathy about the head, neck, or axilla noted.  Eyes: No conjunctival inflammation or lid edema is present. There is no scleral icterus. Ears:  External ear exam shows no significant lesions or deformities.  Otoscopic examination reveals clear canals, tympanic membranes are intact bilaterally without bulging, retraction, inflammation or discharge. Nose:  External nasal examination shows no deformity or inflammation. Nasal mucosa are pink and moist without lesions or exudates. No septal dislocation or deviation.No obstruction to airflow.  Oral exam: Dental hygiene is good; lips and gums are healthy appearing.There is no oropharyngeal erythema or exudate noted.  Neck:  No deformities, thyromegaly, masses, or tenderness noted.   Supple with full range of motion without  pain. Heart:  Normal rate and regular rhythm. S1 and S2 normal without gallop, murmur, click, rub or other extra sounds.  Lungs:No increased work of breathing.   Extremities:  No cyanosis, edema, or clubbing  noted  Skin: Warm & dry w/o jaundice or tenting.          Assessment & Plan:  #1 nonproductive cough  #2 extrinsic symptoms of rhinoconjunctivitis without symptoms of rhinosinusitis @ present  Plan: See orders and after visit summary.

## 2014-02-28 NOTE — Patient Instructions (Signed)
Breo one inhalation daily; gargle and spit after use. Lot #: [id number] Exp 04/17 Instructions written on box   Plain Mucinex (NOT D) for thick secretions ;force NON dairy fluids .   Nasal cleansing in the shower as discussed with lather of mild shampoo.After 10 seconds wash off lather while  exhaling through nostrils. Make sure that all residual soap is removed to prevent irritation.  Flonase OR Nasacort AQ 1 spray in each nostril twice a day as needed. Use the "crossover" technique into opposite nostril spraying toward opposite ear @ 45 degree angle, not straight up into nostril.  Use a Neti pot daily only  as needed for significant sinus congestion; going from open side to congested side . Plain Allegra (NOT D )  160 daily , Loratidine 10 mg , OR Zyrtec 10 mg @ bedtime  as needed for itchy eyes & sneezing.  Fill the  prescription for Singulair it there is not dramatic improvement in the next 48 hours.

## 2014-02-28 NOTE — Progress Notes (Signed)
Pre visit review using our clinic review tool, if applicable. No additional management support is needed unless otherwise documented below in the visit note. 

## 2014-02-28 NOTE — Progress Notes (Signed)
   Subjective:    Patient ID: Monica Michael, female    DOB: 02/02/1961, 53 y.o.   MRN: [id number]  HPI She is here today for evaluation of fever, cough, and congestion.  Symptoms began 9/28 and included rhinitis, head and chest congestion, wheezing, maxillary and frontal sinus pain, cough with thick yellow sputum, itchy watery eyes, sneezing, fever of 101, chills and sweats.   At this tme her only symptoms are a dry cough, wheezing, rhinorrhea with clear sputum, itchy watery eyes, and sneezing.  She has not taken anything for symptoms.  She has had sick contacts with family and coworkers.  She is a current smoker with a 10 pack-year history.  She is interested in quitting and has set a quit date of 03/04/14.  She will receive her flu shot at work.  Review of Systems ROS   She denies nausea and vomiting, sore throat, earache, discharge from ears, shortness of breath, muscle or joint pain.  Objective:   Physical Exam       Assessment & Plan:

## 2014-03-01 ENCOUNTER — Telehealth: Payer: Self-pay | Admitting: Internal Medicine

## 2014-03-01 NOTE — Telephone Encounter (Signed)
emmi emailed °

## 2014-03-14 ENCOUNTER — Encounter: Payer: Self-pay | Admitting: Cardiology

## 2014-03-14 NOTE — Telephone Encounter (Signed)
Our flex provider for today, Sharrell Ku, PA recommends that the pt see Truitt Merle tomorrow. Cecille Rubin is aware and agrees to see the pt at 11:30 tomorrow morning. The pt agrees with plan and thanked me for my assistance.

## 2014-03-14 NOTE — Telephone Encounter (Addendum)
The pt states that she has not been feeling right since Tuesday (10/13) and c/o tingling in her fingers and some dizziness that occurs anytime whether she is sitting, standing, lying down, moving around or not she states it happens at anytime. Because her BP was 94/59  Tuesday am she stopped taking her Carvedilol that morning and hasnt taken anymore.  She reports some of her BP and HR readings as: BP= 94/59 HR= 69 on 10/12 BP= 106/62  HR= 56 at 8:32 on 10/13 BP=  120/69 HR= 57 this am She states that she stopped using Nicoderm patches on Tuesday (10/13) and that she got her flu shot on 10/2. She is concerned about the tingling in her fingers and that her HR is low. Please advise.

## 2014-03-15 ENCOUNTER — Ambulatory Visit (INDEPENDENT_AMBULATORY_CARE_PROVIDER_SITE_OTHER): Payer: 59 | Admitting: Nurse Practitioner

## 2014-03-15 ENCOUNTER — Encounter: Payer: Self-pay | Admitting: Nurse Practitioner

## 2014-03-15 VITALS — BP 120/70 | HR 59 | Ht 64.0 in | Wt 155.0 lb

## 2014-03-15 DIAGNOSIS — I2510 Atherosclerotic heart disease of native coronary artery without angina pectoris: Secondary | ICD-10-CM

## 2014-03-15 DIAGNOSIS — R42 Dizziness and giddiness: Secondary | ICD-10-CM

## 2014-03-15 DIAGNOSIS — I10 Essential (primary) hypertension: Secondary | ICD-10-CM

## 2014-03-15 NOTE — Patient Instructions (Addendum)
Congrats for stopping smoking!!  Stay on your current medicines but stay off the Coreg  Monitor your blood pressure for [place]  We will see you back as planned  Call the [place] office at [phone] if you have any questions, problems or concerns.

## 2014-03-15 NOTE — Progress Notes (Signed)
Monica Michael Date of Birth: August 13, 1960 Medical Record #[id number]  History of Present Illness: Ms. Heyden is seen back today for a work in visit. Seen for Dr. [name]. She is a 53 year old female with CAD with prior NSTEMI in 07/2011 - treated with DES to the mid LCX, past tobacco abuse, DM, HLD, HTN, Paget's disease on chest CT and carotid disease.   Last seen here in June - cardiac status was stable.   Called here yesterday - "The pt states that she has not been feeling right since Tuesday (10/13) and c/o tingling in her fingers and some dizziness that occurs anytime whether she is sitting, standing, lying down, moving around or not she states it happens at anytime. Because her BP was 94/59 Tuesday am she stopped taking her Carvedilol that morning and hasnt taken anymore. She reports some of her BP and HR readings as:  BP= 94/59 HR= 69 on 10/12 BP= 106/62 HR= 56 at 8:32 on 10/13 BP= 120/69 HR= 57 this am She states that she stopped using Nicoderm patches on Tuesday (10/13) and that she got her flu shot on 10/2.  She is concerned about the tingling in her fingers and that her HR is low." Thus added to my schedule for today.   Comes in here. Here alone.  She feels better today. Has stopped her Coreg altogether. She has had a lot going on recently - has been actively losing weight (down like 50 pounds), stopped smoking, got a flu shot, has been sick - on Tuesday she felt like she could not stand up - she was quite dizzy. Fingers on both hands tingling. Checked her BP - she normally does not check her BP at all - BP was low - she stopped the Coreg. Now feeling much better. Very little tingling in fingers on both hands. No chest pain. No throat pain - this was her actual angina with her MI noted.    Current Outpatient Prescriptions  Medication Sig Dispense Refill  . aspirin 81 MG tablet Take 81 mg by mouth daily.      Marland Kitchen glimepiride (AMARYL) 2 MG tablet Take 1 tablet (2 mg total) by mouth daily before  breakfast.  90 tablet  1  . INVOKANA 100 MG TABS TAKE 1 TABLET BY MOUTH EVERY DAY  30 tablet  10  . nitroGLYCERIN (NITROSTAT) 0.4 MG SL tablet Place 0.4 mg under the tongue every 5 (five) minutes as needed for chest pain.      . rosuvastatin (CRESTOR) 40 MG tablet Take 1 tablet (40 mg total) by mouth daily.  30 tablet  11  . Wheat Dextrin (BENEFIBER PO) Take by mouth. Take 6 tsp daily      . [DISCONTINUED] lisinopril (PRINIVIL,ZESTRIL) 10 MG tablet Take 0.5 tablets (5 mg total) by mouth daily.  30 tablet  6   No current facility-administered medications for this visit.    No Known Allergies  Past Medical History  Diagnosis Date  . Hyperlipidemia   . Ejection fraction     EF 60%, catheterization, February, 2013  . Paget's disease of bone     Noted on chest CT scan  . Type II or unspecified type diabetes mellitus without mention of complication, uncontrolled   . Other disorders of bone and cartilage(733.99)   . CAD in native artery Jan '13    NSTEMI 07/2011: LHC 07/03/11: Proximal LAD 50-70%, circumflex 95% mid, ostial RCA 50% EF 60%.  PCI: Resolute DES to the  mid circumflex  . HTN (hypertension)   . Tobacco abuse   . Bronchitis     February, 2013  . Carotid artery disease     Doppler, December, 2013, 3-00% R. ICA, 92-33% LICA  . Myocardial infarction     07/2011  . UTI (urinary tract infection) 03/25/13    Past Surgical History  Procedure Laterality Date  . Abdominal hysterectomy  2001  . Coronary angioplasty with stent placement  07/2011    1 stent    History  Smoking status  . Former Smoker -- 0.50 packs/day for 20 years  . Types: Cigarettes  Smokeless tobacco  . Former Systems developer  . Quit date: 03/04/2014    Comment: discussed cessation strategy, Quit date set for 10/5    History  Alcohol Use No    Family History  Problem Relation Age of Onset  . Heart failure Father 2    deceased  . Diabetes Father   . Kidney failure Father   . Hypertension Father   .  Hyperlipidemia Father   . Coronary artery disease Father   . Hodgkin's lymphoma Mother 63    deceased  . Cancer Neg Hx     breast, colon  . Colon cancer Neg Hx   . Rectal cancer Neg Hx   . Stomach cancer Neg Hx   . Esophageal cancer Neg Hx     Review of Systems: The review of systems is per the HPI.  All other systems were reviewed and are negative.  Physical Exam: BP 120/70  Pulse 59  Ht 5\' 4"  (1.626 m)  Wt 155 lb (70.308 kg)  BMI 26.59 kg/m2 Patient is pleasant and in no acute distress. Skin is warm and dry. Color is normal.  HEENT is unremarkable. Normocephalic/atraumatic. PERRL. Sclera are nonicteric. Neck is supple. No masses. No JVD. Lungs are clear. Cardiac exam shows a regular rate and rhythm. Abdomen is soft. Extremities are without edema. Gait and ROM are intact. No gross neurologic deficits noted.  Wt Readings from Last 3 Encounters:  03/15/14 155 lb (70.308 kg)  02/28/14 152 lb (68.947 kg)  10/29/13 158 lb (71.668 kg)    LABORATORY DATA/PROCEDURES: EKG today shows sinus brady - rate of 59.  Lab Results  Component Value Date   WBC 9.4 10/09/2013   HGB 14.7 10/09/2013   HCT 44.0 10/09/2013   PLT 283.0 10/09/2013   GLUCOSE 73 10/09/2013   CHOL 113 10/09/2013   TRIG 132.0 10/09/2013   HDL 37.10* 10/09/2013   LDLDIRECT 46.6 02/15/2012   LDLCALC 50 10/09/2013   ALT 18 10/09/2013   AST 19 10/09/2013   NA 138 10/09/2013   K 3.8 10/09/2013   CL 103 10/09/2013   CREATININE 0.9 10/09/2013   BUN 12 10/09/2013   CO2 29 10/09/2013   TSH 1.36 10/09/2013   HGBA1C 6.5 10/09/2013   MICROALBUR 6.8* 10/09/2013    BNP (last 3 results) No results found for this basename: PROBNP,  in the last 8760 hours   Diagnostic Cardiac Catheterization and PCI Report  ANBER MCKIVER  53 y.o.  female  20-Jul-1960  Procedure Date: 07/03/2011  Referring Physician: Adella Hare, MD  Primary Cardiologist:: Helyn Numbers, M.D.  PROCEDURE: Left heart catheterization with selective coronary  angiography, left ventriculogram, and DES stent mid circumflex.  INDICATIONS: Non-ST elevation myocardial infarction with recurring pain at rest on IV nitroglycerin and optimal anticoagulation therapy. Elevated cardiac markers.  The risks, benefits, and details of the procedure were  explained to the patient. The patient verbalized understanding and wanted to proceed. Informed written consent was obtained.  PROCEDURE TECHNIQUE: After Xylocaine anesthesia a 5 French sheath was placed in the right radial artery with a single anterior needle wall stick. Coronary angiography was done using a 5 [place] A2 MP, 5 [place] JR 4 catheter. Left ventriculography was done using a 5 [place] A2 MP catheter. 4000 units of IV heparin was administered after a right radial access was achieved.  A 5 [place] EBU coronary guide catheter was then used to perform PCI of the circumflex. Bivalirudin bolus and infusion was then given. ACT was documented to be greater than 600. The patient was loaded with Brilinta orally.  CONTRAST: Total of 165 cc.  COMPLICATIONS: None.  HEMODYNAMICS: Aortic pressure was 107/67 mmHg; LV pressure was 115/36mm mercury; LVEDP 13 mm mercury. There was no gradient between the left ventricle and aorta.  ANGIOGRAPHIC DATA: The left main coronary artery is short and widely patent.  The left anterior descending artery is proximal eccentric 50-70% stenosis.  The left circumflex artery is segmental 95% stenosis before the origin of the first obtuse marginal. The stenosis is in the mid vessel..  The right coronary artery is dominant and contains ostial 50% narrowing.Marland Kitchen  LEFT VENTRICULOGRAM: Left ventricular angiogram was done in the 30 RAO projection and revealed normal left ventricular wall motion and systolic function with an estimated ejection fraction of 60%.  PCI REPORT : The circumflex prior to the bifurcation into the first and second marginals contains an eccentric 95% stenosis in the distal portion of the  diffuse disease segment of approximately 12 mm length. This region was successfully angioplastied and stented with a drug-eluting resolute 3.0 x 15 mm long stent. Post dilatation to 3.5 mm diameter at 12 atmospheres was performed. A beautiful angiographic result was obtained the  IMPRESSIONS: 1. Non-ST elevation myocardial infarction due to 2 high-grade obstruction in the mid circumflex, greater than 95%. TIMI grade 3 flow was noted however.  2. Successful DES implantation in the mid circumflex reducing the 95% stenosis to 0% with TIMI grade 3 flow.  3. Eccentric intermediate proximal LAD stenosis of 50-70%. Ostial 40-50% RCA stenosis.  4. Normal left ventricular function.  RECOMMENDATION: 1. Brilinta and aspirin x12 months.  2. Aggressive diabetes control and typical risk factor modification.  3. Consideration of LAD evaluation with nuclear study at some point in the future.  4. Further management per the [facility] team. Clinically the patient could be discharged tomorrow, 0/09/3974, if no complications.   Carotid doppler from 05/2013 showed 1 to 39% bilateral stenosis - repeat in one year  Assessment / Plan: 1. Dizziness -  Probably from hypotension. Coreg already stopped.   2. HTN -  BP great off of Coreg.   3. CAD -  Prior NSTEMI with PCI - No active chest pain  4. Ongoing tobacco abuse - quit earlier this month - she is Advertising account executive.  She has stopped her Coreg - symptoms have basically resolved - very little tingling in her fingers now - I have left her off the Coreg - she will continue to monitor her BP at home. She is doing a great job at modifying her CV risk factors. Will see back as planned.   Patient is agreeable to this plan and will call if any problems develop in the interim.   Burtis Junes, RN, [facility] [address] [place], [place]  [phone]

## 2014-05-09 ENCOUNTER — Encounter (HOSPITAL_COMMUNITY): Payer: Self-pay | Admitting: Interventional Cardiology

## 2014-07-19 ENCOUNTER — Other Ambulatory Visit: Payer: Self-pay | Admitting: Internal Medicine

## 2014-08-19 ENCOUNTER — Other Ambulatory Visit: Payer: Self-pay | Admitting: Cardiology

## 2014-10-17 ENCOUNTER — Other Ambulatory Visit: Payer: Self-pay | Admitting: Internal Medicine

## 2014-10-30 ENCOUNTER — Ambulatory Visit (INDEPENDENT_AMBULATORY_CARE_PROVIDER_SITE_OTHER): Payer: 59 | Admitting: Cardiology

## 2014-10-30 ENCOUNTER — Encounter: Payer: Self-pay | Admitting: Cardiology

## 2014-10-30 VITALS — BP 126/76 | HR 84 | Ht 64.0 in | Wt 185.2 lb

## 2014-10-30 DIAGNOSIS — I10 Essential (primary) hypertension: Secondary | ICD-10-CM | POA: Diagnosis not present

## 2014-10-30 DIAGNOSIS — I2510 Atherosclerotic heart disease of native coronary artery without angina pectoris: Secondary | ICD-10-CM | POA: Diagnosis not present

## 2014-10-30 DIAGNOSIS — I779 Disorder of arteries and arterioles, unspecified: Secondary | ICD-10-CM

## 2014-10-30 DIAGNOSIS — Z720 Tobacco use: Secondary | ICD-10-CM | POA: Diagnosis not present

## 2014-10-30 DIAGNOSIS — I739 Peripheral vascular disease, unspecified: Secondary | ICD-10-CM

## 2014-10-30 MED ORDER — NITROGLYCERIN 0.4 MG SL SUBL: 0.4000 mg | SUBLINGUAL_TABLET | SUBLINGUAL | Status: DC | PRN

## 2014-10-30 NOTE — Assessment & Plan Note (Signed)
Blood pressures controlled. No change in therapy. 

## 2014-10-30 NOTE — Assessment & Plan Note (Signed)
She has mild carotid disease. It will be time for follow-up Doppler in 3 months.

## 2014-10-30 NOTE — Assessment & Plan Note (Signed)
Patient stop smoking and she continues not to smoke. I have applauded her.

## 2014-10-30 NOTE — Assessment & Plan Note (Signed)
Coronary disease is stable. She does not need any testing. She is doing a good job with risk modification.

## 2014-10-30 NOTE — Patient Instructions (Signed)
Medication Instructions:  Same-No changes  Labwork: None  Testing/Procedures: None  Follow-Up: Your physician wants you to follow-up in: 1 year with Dr Acie Fredrickson. You will receive a reminder letter in the mail two months in advance. If you don't receive a letter, please call our office to schedule the follow-up appointment.

## 2014-10-30 NOTE — Progress Notes (Signed)
Cardiology Office Note   Date:  10/30/2014   ID:  [name], DOB [birth year], MRN [id number]  PCP:  [name], MD  Cardiologist:  Dola Argyle, MD   Chief Complaint  Patient presents with  . Appointment    Follow-up coronary disease      History of Present Illness: Monica Michael is a 54 y.o. female who presents today to follow up coronary disease. She is doing well. She stop smoking. Her blood pressure is better. She has gained some weight off smoking which is working on this.    Past Medical History  Diagnosis Date  . Hyperlipidemia   . Ejection fraction     EF 60%, catheterization, February, 2013  . Paget's disease of bone     Noted on chest CT scan  . Type II or unspecified type diabetes mellitus without mention of complication, uncontrolled   . Other disorders of bone and cartilage(733.99)   . CAD in native artery Jan '13    NSTEMI 07/2011: LHC 07/03/11: Proximal LAD 50-70%, circumflex 95% mid, ostial RCA 50% EF 60%.  PCI: Resolute DES to the mid circumflex  . HTN (hypertension)   . Tobacco abuse   . Bronchitis     February, 2013  . Carotid artery disease     Doppler, December, 2013, 0-86% R. ICA, 76-19% LICA  . Myocardial infarction     07/2011  . UTI (urinary tract infection) 03/25/13    Past Surgical History  Procedure Laterality Date  . Abdominal hysterectomy  2001  . Coronary angioplasty with stent placement  07/2011    1 stent  . Left heart catheterization with coronary angiogram N/A 07/03/2011    Procedure: LEFT HEART CATHETERIZATION WITH CORONARY ANGIOGRAM;  Surgeon: [name], MD;  Location: [facility] CATH LAB;  Service: Cardiovascular;  Laterality: N/A;  . Percutaneous coronary stent intervention (pci-s) Right 07/03/2011    Procedure: PERCUTANEOUS CORONARY STENT INTERVENTION (PCI-S);  Surgeon: [name], MD;  Location: [facility] CATH LAB;  Service: Cardiovascular;  Laterality: Right;    Patient Active Problem List   Diagnosis Date Noted  . Loss  of weight 10/09/2013  . Preventative health care 10/09/2013  . Thyroid nodule 10/09/2013  . Nonspecific abnormal results of thyroid function study 09/23/2013  . Carotid artery disease   . HTN (hypertension) 07/23/2011  . CAD in native artery   . Tobacco abuse 07/04/2011  . Paget's disease of bone   . DIAB W/O MENTION COMP TYPE II/UNS TYPE UNCNTRL 02/13/2009  . Mixed hyperlipidemia 02/13/2009  . OTHER DISORDERS OF BONE AND CARTILAGE OTHER 02/13/2009  . PSORIASIS 06/27/2007  . SHOULDER PAIN, RIGHT 06/27/2007      Current Outpatient Prescriptions  Medication Sig Dispense Refill  . aspirin 81 MG tablet Take 81 mg by mouth daily.    Marland Kitchen glimepiride (AMARYL) 2 MG tablet TAKE 1 TABLET DAILY BEFORE BREAKFAST 90 tablet 0  . INVOKANA 100 MG TABS TAKE 1 TABLET BY MOUTH EVERY DAY 30 tablet 10  . nitroGLYCERIN (NITROSTAT) 0.4 MG SL tablet Place 0.4 mg under the tongue every 5 (five) minutes as needed for chest pain.    . rosuvastatin (CRESTOR) 40 MG tablet Take 1 tablet (40 mg total) by mouth daily. 30 tablet 11  . Wheat Dextrin (BENEFIBER PO) Take by mouth. Take 6 tsp daily    . [DISCONTINUED] lisinopril (PRINIVIL,ZESTRIL) 10 MG tablet Take 0.5 tablets (5 mg total) by mouth daily. 30 tablet 6   No  current facility-administered medications for this visit.    Allergies:   Review of patient's allergies indicates no known allergies.    Social History:  The patient  reports that she has quit smoking. Her smoking use included Cigarettes. She has a 10 pack-year smoking history. She quit smokeless tobacco use about 7 months ago. She reports that she does not drink alcohol or use illicit drugs.   Family History:  The patient's family history includes Coronary artery disease in her father; Diabetes in her father; Heart failure (age of onset: 22) in her father; Hodgkin's lymphoma (age of onset: 52) in her mother; Hyperlipidemia in her father; Hypertension in her father; Kidney failure in her father. There  is no history of Cancer, Colon cancer, Rectal cancer, Stomach cancer, or Esophageal cancer.    ROS:  Please see the history of present illness.     Patient denies fever, chills, headache, sweats, rash, change in vision, change in hearing, chest pain, cough, nausea or vomiting, urinary symptoms. All other systems are reviewed and are negative.   PHYSICAL EXAM: VS:  BP 126/76 mmHg  Pulse 84  Ht 5\' 4"  (1.626 m)  Wt 185 lb 3.2 oz (84.006 kg)  BMI 31.77 kg/m2 , Patient is oriented to person time and place. Affect is normal. Head is atraumatic. Sclera conjunctiva are normal. There is no jugulovenous distention. Lungs are clear. Respiratory effort is not labored. Cardiac exam reveals S1 and S2. Abdomen is soft. There is no peripheral edema. There are no musculoskeletal deformities. There are no skin rashes.  EKG:   EKG is not done today.   Recent Labs: No results found for requested labs within last 365 days.    Lipid Panel    Component Value Date/Time   CHOL 113 10/09/2013 1702   TRIG 132.0 10/09/2013 1702   TRIG 147 06/03/2006 0723   HDL 37.10* 10/09/2013 1702   CHOLHDL 3 10/09/2013 1702   CHOLHDL 6.9 CALC 06/03/2006 0723   VLDL 26.4 10/09/2013 1702   LDLCALC 50 10/09/2013 1702   LDLDIRECT 46.6 02/15/2012 1715      Wt Readings from Last 3 Encounters:  10/30/14 185 lb 3.2 oz (84.006 kg)  03/15/14 155 lb (70.308 kg)  02/28/14 152 lb (68.947 kg)      Current medicines are reviewed   The patient understands her medications.    ASSESSMENT AND PLAN:

## 2014-11-04 ENCOUNTER — Encounter: Payer: Self-pay | Admitting: Family

## 2014-11-04 ENCOUNTER — Ambulatory Visit (INDEPENDENT_AMBULATORY_CARE_PROVIDER_SITE_OTHER): Payer: 59 | Admitting: Family

## 2014-11-04 VITALS — BP 124/72 | HR 88 | Temp 98.2°F | Resp 18 | Ht 64.0 in | Wt 184.0 lb

## 2014-11-04 DIAGNOSIS — Z0000 Encounter for general adult medical examination without abnormal findings: Secondary | ICD-10-CM | POA: Diagnosis not present

## 2014-11-04 DIAGNOSIS — Z23 Encounter for immunization: Secondary | ICD-10-CM

## 2014-11-04 MED ORDER — GLIMEPIRIDE 2 MG PO TABS: 2.0000 mg | ORAL_TABLET | Freq: Every day | ORAL | Status: DC

## 2014-11-04 NOTE — Progress Notes (Signed)
Subjective:    Patient ID: Monica Michael, female    DOB: [birth year], 54 y.o.   MRN: [id number]  Chief Complaint  Patient presents with  . Establish Care    wants A1c checked, needs diabetic medication filled, did have some polyps on her thyroid in the past and would also like her thyroid looked after    HPI:  Monica Michael is a 54 y.o. female who presents today for an annual wellness visit.   1) Health Maintenance -   Diet - Averages about 3 meals per day; fruits, vegetables, protein; just started Weight Watchers; Caffeine about 2 cups per day.  Exercise - Not as much as she should - walks about 2 miles per day  2) Preventative Exams / Immunizations:  Dental -- Up to date  Vision -- Due for exam    Health Maintenance  Topic Date Due  . PNEUMOCOCCAL POLYSACCHARIDE VACCINE (1) 08/31/1961  . FOOT EXAM  08/31/1969  . OPHTHALMOLOGY EXAM  08/31/1969  . HIV Screening  09/01/1974  . MAMMOGRAM  09/07/2013  . HEMOGLOBIN A1C  04/11/2014  . URINE MICROALBUMIN  10/10/2014  . INFLUENZA VACCINE  12/30/2014  . PAP SMEAR  01/01/2017  . COLONOSCOPY  03/27/2018  . TETANUS/TDAP  08/08/2021  PPSV23; Mammogram and PAP scheduled for August;  Immunization History  Administered Date(s) Administered  . Tdap 08/09/2011    No Known Allergies   Outpatient Prescriptions Prior to Visit  Medication Sig Dispense Refill  . aspirin 81 MG tablet Take 81 mg by mouth daily.    . INVOKANA 100 MG TABS TAKE 1 TABLET BY MOUTH EVERY DAY 30 tablet 10  . nitroGLYCERIN (NITROSTAT) 0.4 MG SL tablet Place 1 tablet (0.4 mg total) under the tongue every 5 (five) minutes as needed for chest pain. 25 tablet 3  . rosuvastatin (CRESTOR) 40 MG tablet Take 1 tablet (40 mg total) by mouth daily. 30 tablet 11  . Wheat Dextrin (BENEFIBER PO) Take by mouth. Take 6 tsp daily    . glimepiride (AMARYL) 2 MG tablet TAKE 1 TABLET DAILY BEFORE BREAKFAST 90 tablet 0   No facility-administered medications prior to visit.      Past Medical History  Diagnosis Date  . Hyperlipidemia   . Ejection fraction     EF 60%, catheterization, February, 2013  . Paget's disease of bone     Noted on chest CT scan  . Type II or unspecified type diabetes mellitus without mention of complication, uncontrolled   . Other disorders of bone and cartilage(733.99)   . CAD in native artery Jan '13    NSTEMI 07/2011: LHC 07/03/11: Proximal LAD 50-70%, circumflex 95% mid, ostial RCA 50% EF 60%.  PCI: Resolute DES to the mid circumflex  . HTN (hypertension)   . Tobacco abuse   . Bronchitis     February, 2013  . Carotid artery disease     Doppler, December, 2013, 0-62% R. ICA, 37-62% LICA  . Myocardial infarction     07/2011  . UTI (urinary tract infection) 03/25/13     Past Surgical History  Procedure Laterality Date  . Abdominal hysterectomy  2001  . Coronary angioplasty with stent placement  07/2011    1 stent  . Left heart catheterization with coronary angiogram N/A 07/03/2011    Procedure: LEFT HEART CATHETERIZATION WITH CORONARY ANGIOGRAM;  Surgeon: [name], MD;  Location: [facility] CATH LAB;  Service: Cardiovascular;  Laterality: N/A;  . Percutaneous coronary stent intervention (  pci-s) Right 07/03/2011    Procedure: PERCUTANEOUS CORONARY STENT INTERVENTION (PCI-S);  Surgeon: [name], MD;  Location: [facility] CATH LAB;  Service: Cardiovascular;  Laterality: Right;     Family History  Problem Relation Age of Onset  . Heart failure Father 46    deceased  . Diabetes Father   . Kidney failure Father   . Hypertension Father   . Hyperlipidemia Father   . Coronary artery disease Father   . Hodgkin's lymphoma Mother 3    deceased  . Cancer Neg Hx     breast, colon  . Colon cancer Neg Hx   . Rectal cancer Neg Hx   . Stomach cancer Neg Hx   . Esophageal cancer Neg Hx      History   Social History  . Marital Status: Widowed    Spouse Name: N/A  . Number of Children: 1  . Years of Education: 16    Occupational History  .  [place] History Main Topics  . Smoking status: Former Smoker -- 0.50 packs/day for 20 years    Types: Cigarettes  . Smokeless tobacco: Never Used     Comment: discussed cessation strategy, Quit date set for 10/5  . Alcohol Use: 1.2 oz/week    2 Cans of beer per week  . Drug Use: No  . Sexual Activity: Not Currently   Other Topics Concern  . Not on file   Social History Narrative   [employer]. Married '84-14 yrs; divorced; married '06 - 5 yers/widowed. 1 son - '89. Work-quality reviewer in an office setting.     Review of Systems  Constitutional: Denies fever, chills, fatigue, or significant weight gain/loss. HENT: Head: Denies headache or neck pain Ears: Denies changes in hearing, ringing in ears, earache, drainage Nose: Denies discharge, stuffiness, itching, nosebleed, sinus pain Throat: Denies sore throat, hoarseness, dry mouth, sores, thrush Eyes: Denies loss/changes in vision, pain, redness, blurry/double vision, flashing lights Cardiovascular: Denies chest pain/discomfort, tightness, palpitations, shortness of breath with activity, difficulty lying down, swelling, sudden awakening with shortness of breath Respiratory: Denies shortness of breath, cough, sputum production, wheezing Gastrointestinal: Denies dysphasia, heartburn, change in appetite, nausea, change in bowel habits, rectal bleeding, constipation, diarrhea, yellow skin or eyes Genitourinary: Denies frequency, urgency, burning/pain, blood in urine, incontinence, change in urinary strength. Musculoskeletal: Denies muscle/joint pain, stiffness, back pain, redness or swelling of joints, trauma Skin: Denies rashes, lumps, itching, dryness, color changes, or hair/nail changes Neurological: Denies dizziness, fainting, seizures, weakness, numbness, tingling, tremor Psychiatric - Denies nervousness, stress, depression or memory loss Endocrine: Denies heat or cold intolerance,  sweating, frequent urination, excessive thirst, changes in appetite Hematologic: Denies ease of bruising or bleeding     Objective:     BP 124/72 mmHg  Pulse 88  Temp(Src) 98.2 F (36.8 C) (Oral)  Resp 18  Ht 5\' 4"  (1.626 m)  Wt 184 lb (83.462 kg)  BMI 31.57 kg/m2  SpO2 96% Nursing note and vital signs reviewed.  Physical Exam  Constitutional: She is oriented to person, place, and time. She appears well-developed and well-nourished.  HENT:  Head: Normocephalic.  Right Ear: Hearing, tympanic membrane, external ear and ear canal normal.  Left Ear: Hearing, tympanic membrane, external ear and ear canal normal.  Nose: Nose normal.  Mouth/Throat: Uvula is midline, oropharynx is clear and moist and mucous membranes are normal.  Eyes: Conjunctivae and EOM are normal. Pupils are equal, round, and reactive to light.  Neck: Neck  supple. No JVD present. No tracheal deviation present. No thyromegaly present.  Cardiovascular: Normal rate, regular rhythm, normal heart sounds and intact distal pulses.   Pulmonary/Chest: Effort normal and breath sounds normal.  Abdominal: Soft. Bowel sounds are normal. She exhibits no distension and no mass. There is no tenderness. There is no rebound and no guarding.  Musculoskeletal: Normal range of motion. She exhibits no edema or tenderness.  Lymphadenopathy:    She has no cervical adenopathy.  Neurological: She is alert and oriented to person, place, and time. She has normal reflexes. No cranial nerve deficit. She exhibits normal muscle tone. Coordination normal.  Diabetic foot exam - bilateral feet are free from skin breakdown, cuts, and abrasions. Toenails are neatly trimmed. Pulses are intact and appropriate. Sensation is intact to monofilament bilaterally.   Skin: Skin is warm and dry.  Bilateral psoriasis on bottom of feet noted.   Psychiatric: She has a normal mood and affect. Her behavior is normal. Judgment and thought content normal.         Assessment & Plan:    Problem List Items Addressed This Visit      Other   Routine general medical examination at a health care facility - Primary    1) Anticipatory Guidance: Discussed importance of wearing a seatbelt while driving and not texting while driving; changing batteries in smoke detector at least once annually; wearing suntan lotion when outside; eating a balanced and moderate diet; getting physical activity at least 30 minutes per day.  2) Immunizations / Screenings / Labs:  Penumovax updated. All other immunizations are up to date per recommendations. Due for a mammogram which is scheduled in August. Due for diabetic blood work - obtain A1c and microalbumen. Diabetic foot exam completed today. Due for diabetic eye exam which she will schedule independently. Obtain CBC, CMET, Lipid profile and TSH and T4.  Overall well exam. Patient has cardiovascular risk factors including hyperlipidemia, hypertension, type 2 diabetes, and obesity. She has made significant strides in improving her health by quitting her tobacco intake starting the Weight Watchers program. Hyperlipidemia is currently controlled with Crestor; type 2 diabetes currently controlled Amaryl and Invokana; and her hypertension was recently improved enough that she does not require medication as below goal of 140/90. Continue current healthy lifestyle choices. Follow up prevention exam in 1 year. Follow-up office visit pending blood work.      Relevant Orders   Comprehensive metabolic panel   CBC   Lipid panel   TSH   T4, free   Hemoglobin A1c   Microalbumin / creatinine urine ratio

## 2014-11-04 NOTE — Assessment & Plan Note (Signed)
1) Anticipatory Guidance: Discussed importance of wearing a seatbelt while driving and not texting while driving; changing batteries in smoke detector at least once annually; wearing suntan lotion when outside; eating a balanced and moderate diet; getting physical activity at least 30 minutes per day.  2) Immunizations / Screenings / Labs:  Penumovax updated. All other immunizations are up to date per recommendations. Due for a mammogram which is scheduled in August. Due for diabetic blood work - obtain A1c and microalbumen. Diabetic foot exam completed today. Due for diabetic eye exam which she will schedule independently. Obtain CBC, CMET, Lipid profile and TSH and T4.  Overall well exam. Patient has cardiovascular risk factors including hyperlipidemia, hypertension, type 2 diabetes, and obesity. She has made significant strides in improving her health by quitting her tobacco intake starting the Weight Watchers program. Hyperlipidemia is currently controlled with Crestor; type 2 diabetes currently controlled Amaryl and Invokana; and her hypertension was recently improved enough that she does not require medication as below goal of 140/90. Continue current healthy lifestyle choices. Follow up prevention exam in 1 year. Follow-up office visit pending blood work.

## 2014-11-04 NOTE — Addendum Note (Signed)
Addended by: Delice Bison E on: 11/04/2014 04:48 PM   Modules accepted: Orders

## 2014-11-04 NOTE — Patient Instructions (Addendum)
Thank you for choosing [facility].  Summary/Instructions:  Your prescription(s) have been submitted to your pharmacy or been printed and provided for you. Please take as directed and contact our office if you believe you are having problem(s) with the medication(s) or have any questions.  Please stop by the lab on the basement level of the building for your blood work. Your results will be released to MyChart (or called to you) after review, usually within 72 hours after test completion. If any changes need to be made, you will be notified at that same time.  Health Maintenance Adopting a healthy lifestyle and getting preventive care can go a long way to promote health and wellness. Talk with your health care provider about what schedule of regular examinations is right for you. This is a good chance for you to check in with your provider about disease prevention and staying healthy. In between checkups, there are plenty of things you can do on your own. Experts have done a lot of research about which lifestyle changes and preventive measures are most likely to keep you healthy. Ask your health care provider for more information. WEIGHT AND DIET  Eat a healthy diet  Be sure to include plenty of vegetables, fruits, low-fat dairy products, and lean protein.  Do not eat a lot of foods high in solid fats, added sugars, or salt.  Get regular exercise. This is one of the most important things you can do for your health.  Most adults should exercise for at least 150 minutes each week. The exercise should increase your heart rate and make you sweat (moderate-intensity exercise).  Most adults should also do strengthening exercises at least twice a week. This is in addition to the moderate-intensity exercise.  Maintain a healthy weight  Body mass index (BMI) is a measurement that can be used to identify possible weight problems. It estimates body fat based on height and weight. Your health  care provider can help determine your BMI and help you achieve or maintain a healthy weight.  For females 20 years of age and older:   A BMI below 18.5 is considered underweight.  A BMI of 18.5 to 24.9 is normal.  A BMI of 25 to 29.9 is considered overweight.  A BMI of 30 and above is considered obese.  Watch levels of cholesterol and blood lipids  You should start having your blood tested for lipids and cholesterol at 55 years of age, then have this test every 5 years.  You may need to have your cholesterol levels checked more often if:  Your lipid or cholesterol levels are high.  You are older than 55 years of age.  You are at high risk for heart disease.  CANCER SCREENING   Lung Cancer  Lung cancer screening is recommended for adults 55-80 years old who are at high risk for lung cancer because of a history of smoking.  A yearly low-dose CT scan of the lungs is recommended for people who:  Currently smoke.  Have quit within the past 15 years.  Have at least a 30-pack-year history of smoking. A pack year is smoking an average of one pack of cigarettes a day for 1 year.  Yearly screening should continue until it has been 15 years since you quit.  Yearly screening should stop if you develop a health problem that would prevent you from having lung cancer treatment.  Breast Cancer  Practice breast self-awareness. This means understanding how your breasts normally appear   and feel.  It also means doing regular breast self-exams. Let your health care provider know about any changes, no matter how small.  If you are in your 20s or 30s, you should have a clinical breast exam (CBE) by a health care provider every 1-3 years as part of a regular health exam.  If you are 40 or older, have a CBE every year. Also consider having a breast X-ray (mammogram) every year.  If you have a family history of breast cancer, talk to your health care provider about genetic  screening.  If you are at high risk for breast cancer, talk to your health care provider about having an MRI and a mammogram every year.  Breast cancer gene (BRCA) assessment is recommended for women who have family members with BRCA-related cancers. BRCA-related cancers include:  Breast.  Ovarian.  Tubal.  Peritoneal cancers.  Results of the assessment will determine the need for genetic counseling and BRCA1 and BRCA2 testing. Cervical Cancer Routine pelvic examinations to screen for cervical cancer are no longer recommended for nonpregnant women who are considered low risk for cancer of the pelvic organs (ovaries, uterus, and vagina) and who do not have symptoms. A pelvic examination may be necessary if you have symptoms including those associated with pelvic infections. Ask your health care provider if a screening pelvic exam is right for you.   The Pap test is the screening test for cervical cancer for women who are considered at risk.  If you had a hysterectomy for a problem that was not cancer or a condition that could lead to cancer, then you no longer need Pap tests.  If you are older than 65 years, and you have had normal Pap tests for the past 10 years, you no longer need to have Pap tests.  If you have had past treatment for cervical cancer or a condition that could lead to cancer, you need Pap tests and screening for cancer for at least 20 years after your treatment.  If you no longer get a Pap test, assess your risk factors if they change (such as having a new sexual partner). This can affect whether you should start being screened again.  Some women have medical problems that increase their chance of getting cervical cancer. If this is the case for you, your health care provider may recommend more frequent screening and Pap tests.  The human papillomavirus (HPV) test is another test that may be used for cervical cancer screening. The HPV test looks for the virus that can  cause cell changes in the cervix. The cells collected during the Pap test can be tested for HPV.  The HPV test can be used to screen women 30 years of age and older. Getting tested for HPV can extend the interval between normal Pap tests from three to five years.  An HPV test also should be used to screen women of any age who have unclear Pap test results.  After 55 years of age, women should have HPV testing as often as Pap tests.  Colorectal Cancer  This type of cancer can be detected and often prevented.  Routine colorectal cancer screening usually begins at 55 years of age and continues through 55 years of age.  Your health care provider may recommend screening at an earlier age if you have risk factors for colon cancer.  Your health care provider may also recommend using home test kits to check for hidden blood in the stool.  A small   camera at the end of a tube can be used to examine your colon directly (sigmoidoscopy or colonoscopy). This is done to check for the earliest forms of colorectal cancer.  Routine screening usually begins at age 47.  Direct examination of the colon should be repeated every 5-10 years through 55 years of age. However, you may need to be screened more often if early forms of precancerous polyps or small growths are found. Skin Cancer  Check your skin from head to toe regularly.  Tell your health care provider about any new moles or changes in moles, especially if there is a change in a mole's shape or color.  Also tell your health care provider if you have a mole that is larger than the size of a pencil eraser.  Always use sunscreen. Apply sunscreen liberally and repeatedly throughout the day.  Protect yourself by wearing long sleeves, pants, a wide-brimmed hat, and sunglasses whenever you are outside. HEART DISEASE, DIABETES, AND HIGH BLOOD PRESSURE   Have your blood pressure checked at least every 1-2 years. High blood pressure causes heart  disease and increases the risk of stroke.  If you are between 61 years and 67 years old, ask your health care provider if you should take aspirin to prevent strokes.  Have regular diabetes screenings. This involves taking a blood sample to check your fasting blood sugar level.  If you are at a normal weight and have a low risk for diabetes, have this test once every three years after 55 years of age.  If you are overweight and have a high risk for diabetes, consider being tested at a younger age or more often. PREVENTING INFECTION  Hepatitis B  If you have a higher risk for hepatitis B, you should be screened for this virus. You are considered at high risk for hepatitis B if:  You were born in a country where hepatitis B is common. Ask your health care provider which countries are considered high risk.  Your parents were born in a high-risk country, and you have not been immunized against hepatitis B (hepatitis B vaccine).  You have HIV or AIDS.  You use needles to inject street drugs.  You live with someone who has hepatitis B.  You have had sex with someone who has hepatitis B.  You get hemodialysis treatment.  You take certain medicines for conditions, including cancer, organ transplantation, and autoimmune conditions. Hepatitis C  Blood testing is recommended for:  Everyone born from [birth year] through 1966.  Anyone with known risk factors for hepatitis C. Sexually transmitted infections (STIs)  You should be screened for sexually transmitted infections (STIs) including gonorrhea and chlamydia if:  You are sexually active and are younger than 55 years of age.  You are older than 55 years of age and your health care provider tells you that you are at risk for this type of infection.  Your sexual activity has changed since you were last screened and you are at an increased risk for chlamydia or gonorrhea. Ask your health care provider if you are at risk.  If you do not have  HIV, but are at risk, it may be recommended that you take a prescription medicine daily to prevent HIV infection. This is called pre-exposure prophylaxis (PrEP). You are considered at risk if:  You are sexually active and do not regularly use condoms or know the HIV status of your partner(s).  You take drugs by injection.  You are sexually active with a  partner who has HIV. Talk with your health care provider about whether you are at high risk of being infected with HIV. If you choose to begin PrEP, you should first be tested for HIV. You should then be tested every 3 months for as long as you are taking PrEP.  PREGNANCY   If you are premenopausal and you may become pregnant, ask your health care provider about preconception counseling.  If you may become pregnant, take 400 to 800 micrograms (mcg) of folic acid every day.  If you want to prevent pregnancy, talk to your health care provider about birth control (contraception). OSTEOPOROSIS AND MENOPAUSE   Osteoporosis is a disease in which the bones lose minerals and strength with aging. This can result in serious bone fractures. Your risk for osteoporosis can be identified using a bone density scan.  If you are 65 years of age or older, or if you are at risk for osteoporosis and fractures, ask your health care provider if you should be screened.  Ask your health care provider whether you should take a calcium or vitamin D supplement to lower your risk for osteoporosis.  Menopause may have certain physical symptoms and risks.  Hormone replacement therapy may reduce some of these symptoms and risks. Talk to your health care provider about whether hormone replacement therapy is right for you.  HOME CARE INSTRUCTIONS   Schedule regular health, dental, and eye exams.  Stay current with your immunizations.   Do not use any tobacco products including cigarettes, chewing tobacco, or electronic cigarettes.  If you are pregnant, do not  drink alcohol.  If you are breastfeeding, limit how much and how often you drink alcohol.  Limit alcohol intake to no more than 1 drink per day for nonpregnant women. One drink equals 12 ounces of beer, 5 ounces of wine, or 1 ounces of hard liquor.  Do not use street drugs.  Do not share needles.  Ask your health care provider for help if you need support or information about quitting drugs.  Tell your health care provider if you often feel depressed.  Tell your health care provider if you have ever been abused or do not feel safe at home. Document Released: 11/30/2010 Document Revised: 10/01/2013 Document Reviewed: 04/18/2013 Exit[employer] Patient Information 2015 [employer]. This information is not intended to replace advice given to you by your health care provider. Make sure you discuss any questions you have with your health care provider.   

## 2014-11-04 NOTE — Progress Notes (Signed)
Pre visit review using our clinic review tool, if applicable. No additional management support is needed unless otherwise documented below in the visit note. 

## 2014-11-06 ENCOUNTER — Other Ambulatory Visit (INDEPENDENT_AMBULATORY_CARE_PROVIDER_SITE_OTHER): Payer: 59

## 2014-11-06 ENCOUNTER — Other Ambulatory Visit: Payer: Self-pay | Admitting: Internal Medicine

## 2014-11-06 ENCOUNTER — Encounter: Payer: Self-pay | Admitting: Family

## 2014-11-06 DIAGNOSIS — Z0000 Encounter for general adult medical examination without abnormal findings: Secondary | ICD-10-CM | POA: Diagnosis not present

## 2014-11-06 LAB — CBC
HEMATOCRIT: 42.6 % (ref 36.0–46.0)
HEMOGLOBIN: 14.1 g/dL (ref 12.0–15.0)
MCHC: 33.1 g/dL (ref 30.0–36.0)
MCV: 83.4 fl (ref 78.0–100.0)
Platelets: 290 10*3/uL (ref 150.0–400.0)
RBC: 5.1 Mil/uL (ref 3.87–5.11)
RDW: 17.2 % — AB (ref 11.5–15.5)
WBC: 7.9 10*3/uL (ref 4.0–10.5)

## 2014-11-06 LAB — LIPID PANEL
Cholesterol: 118 mg/dL (ref 0–200)
HDL: 43.6 mg/dL (ref >39.00)
LDL Cholesterol: 44 mg/dL (ref 0–99)
NONHDL: 74.4
TRIGLYCERIDES: 154 mg/dL — AB (ref 0.0–149.0)
Total CHOL/HDL Ratio: 3
VLDL: 30.8 mg/dL (ref 0.0–40.0)

## 2014-11-06 LAB — COMPREHENSIVE METABOLIC PANEL
ALT: 14 U/L (ref 0–35)
AST: 14 U/L (ref 0–37)
Albumin: 4 g/dL (ref 3.5–5.2)
Alkaline Phosphatase: 107 U/L (ref 39–117)
BUN: 14 mg/dL (ref 6–23)
CO2: 29 mEq/L (ref 19–32)
Calcium: 9.5 mg/dL (ref 8.4–10.5)
Chloride: 103 mEq/L (ref 96–112)
Creatinine, Ser: 0.95 mg/dL (ref 0.40–1.20)
GFR: 64.87 mL/min (ref >60.00)
GLUCOSE: 189 mg/dL — AB (ref 70–99)
Potassium: 4.1 mEq/L (ref 3.5–5.1)
Sodium: 138 mEq/L (ref 135–145)
TOTAL PROTEIN: 7.6 g/dL (ref 6.0–8.3)
Total Bilirubin: 0.5 mg/dL (ref 0.2–1.2)

## 2014-11-06 LAB — TSH: TSH: 1.48 u[IU]/mL (ref 0.35–4.50)

## 2014-11-06 LAB — T4, FREE: Free T4: 0.71 ng/dL (ref 0.60–1.60)

## 2014-11-06 LAB — MICROALBUMIN / CREATININE URINE RATIO
Creatinine,U: 102.5 mg/dL
MICROALB/CREAT RATIO: 5.6 mg/g (ref 0.0–30.0)
Microalb, Ur: 5.7 mg/dL — ABNORMAL HIGH (ref 0.0–1.9)

## 2014-11-06 LAB — HEMOGLOBIN A1C: HEMOGLOBIN A1C: 7.3 % — AB (ref 4.6–6.5)

## 2014-11-25 ENCOUNTER — Other Ambulatory Visit: Payer: Self-pay

## 2014-12-07 ENCOUNTER — Other Ambulatory Visit: Payer: Self-pay | Admitting: Internal Medicine

## 2015-02-01 ENCOUNTER — Other Ambulatory Visit: Payer: Self-pay | Admitting: Family

## 2015-02-15 ENCOUNTER — Other Ambulatory Visit: Payer: Self-pay | Admitting: Cardiology

## 2015-02-17 NOTE — Telephone Encounter (Signed)
Since this is a cholesterol medication it is ok to fill. Thanks.

## 2015-02-17 NOTE — Telephone Encounter (Signed)
Looks like this is usually refilled by pcp, but pharmacy is requesting under Dr [name]. Please advise. Thanks, MI

## 2015-02-17 NOTE — Telephone Encounter (Signed)
OK to refill

## 2015-04-08 ENCOUNTER — Encounter: Payer: Self-pay | Admitting: Internal Medicine

## 2015-04-08 MED ORDER — CANAGLIFLOZIN 100 MG PO TABS: 100.0000 mg | ORAL_TABLET | Freq: Every day | ORAL | Status: DC

## 2015-05-03 ENCOUNTER — Other Ambulatory Visit: Payer: Self-pay | Admitting: Family

## 2015-06-30 ENCOUNTER — Telehealth: Payer: Self-pay

## 2015-06-30 NOTE — Telephone Encounter (Signed)
PA received via CoverMyMeds for [employer]. PA determination completed and approved online: Case [id number],  Approved; Coverage Start Date:06/01/2015;Coverage End Date:06/30/2016. [employer] notified via CoverMyMeds

## 2015-07-09 IMAGING — US US SOFT TISSUE HEAD/NECK
1 series · 13 of 25 positions shown · non-contrast
Comparison: None.

CLINICAL DATA: Thyroid nodules detected during carotid ultrasound.

EXAM:
THYROID ULTRASOUND
TECHNIQUE: Ultrasound examination of the thyroid gland and adjacent soft
tissues was performed.

[Series 1: us soft tissue head/neck · 0.06mm/px · 13 of 63 slices shown]
[im 1/63]
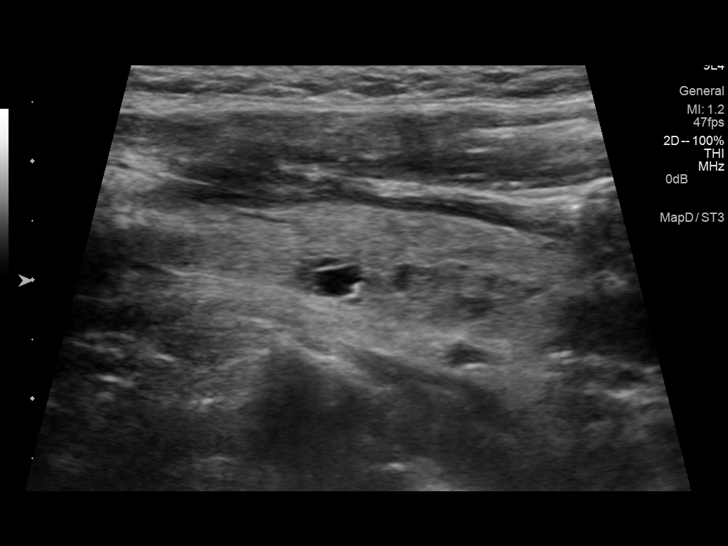
[im 6/63]
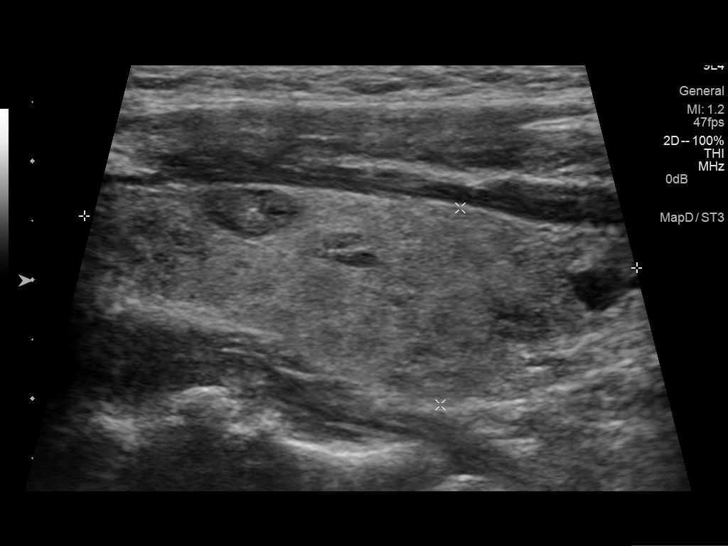
[im 11/63]
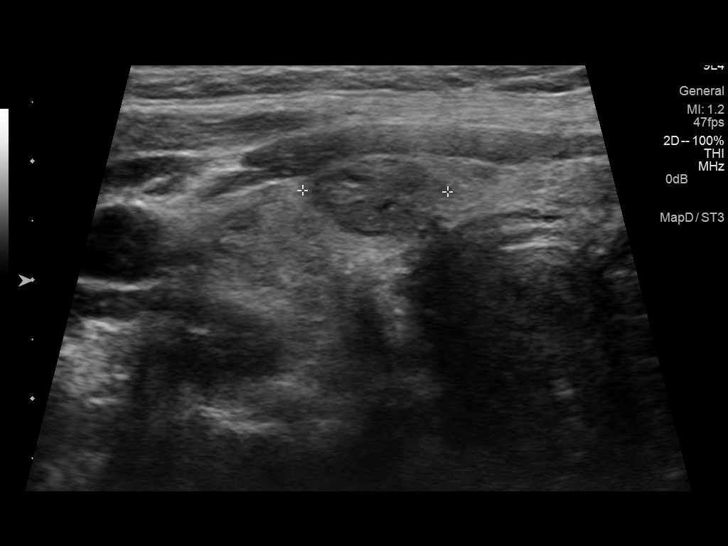
[im 16/63]
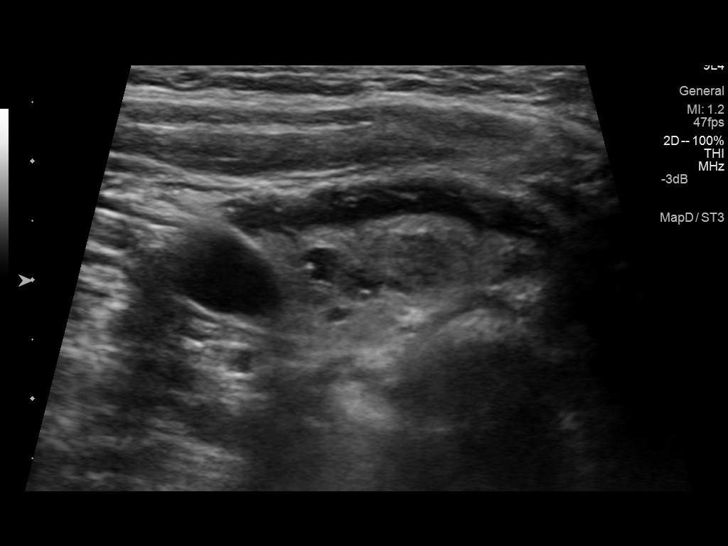
[im 21/63]
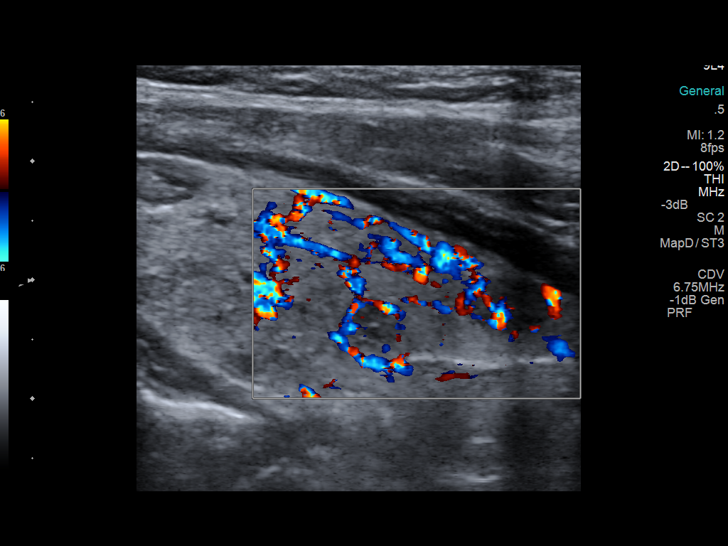
[im 26/63]
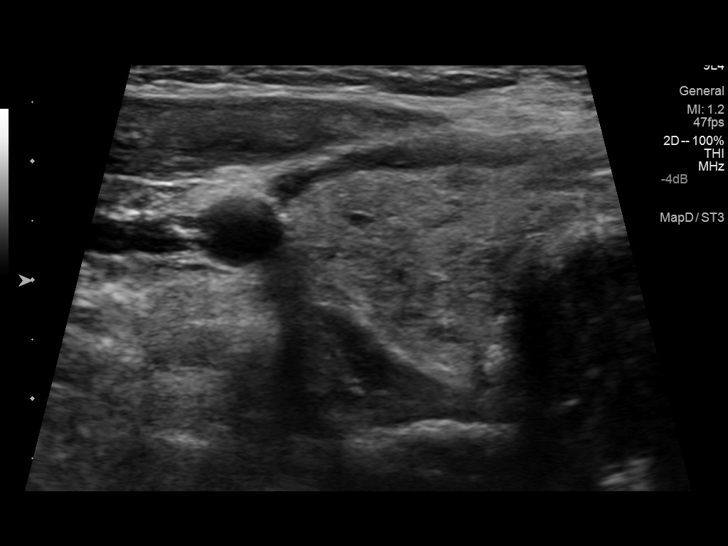
[im 32/63]
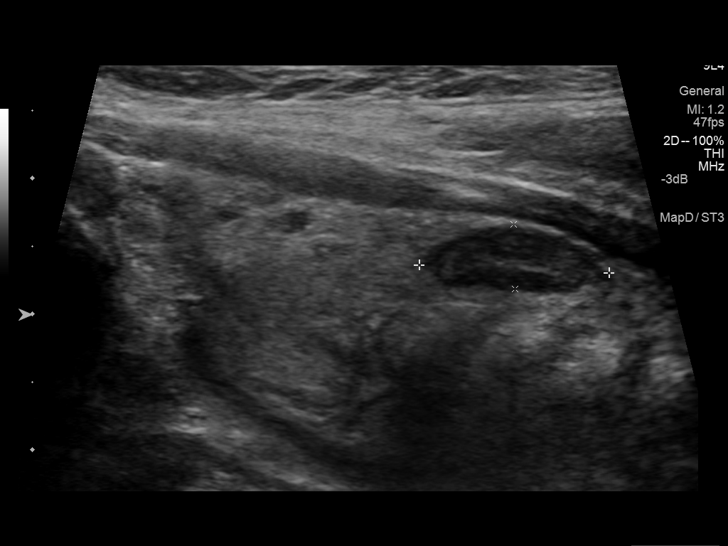
[im 37/63]
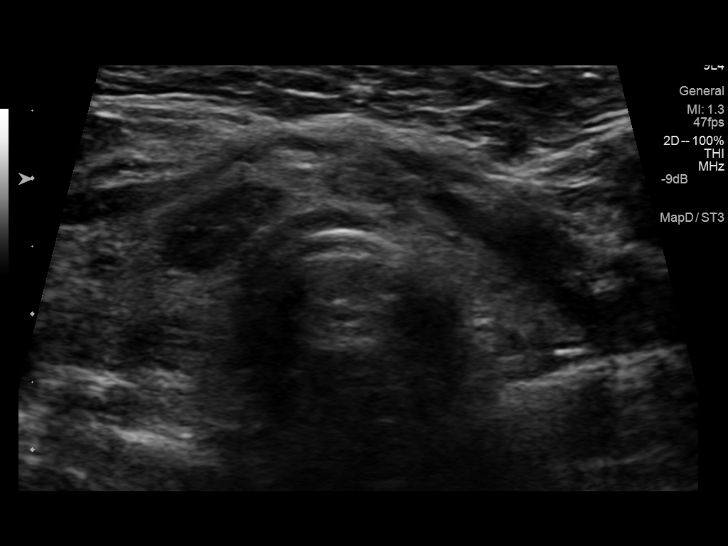
[im 42/63]
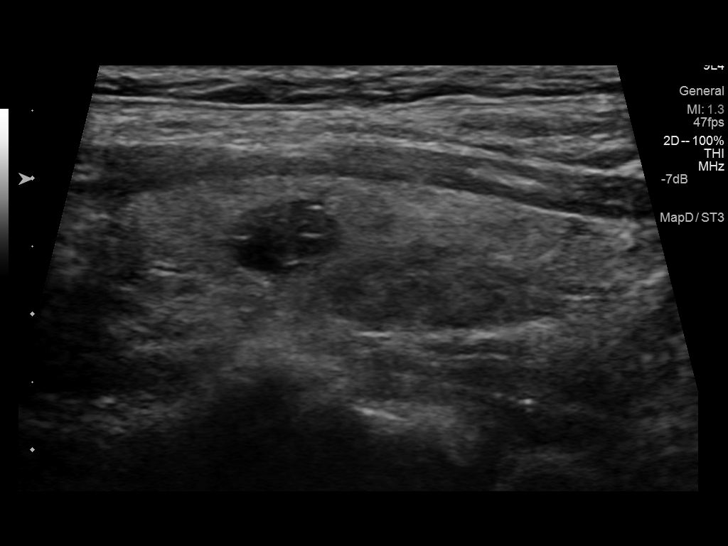
[im 47/63]
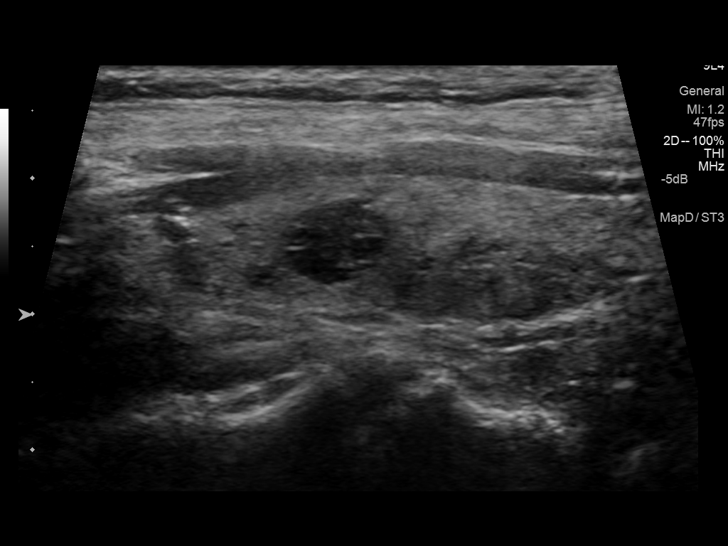
[im 52/63]
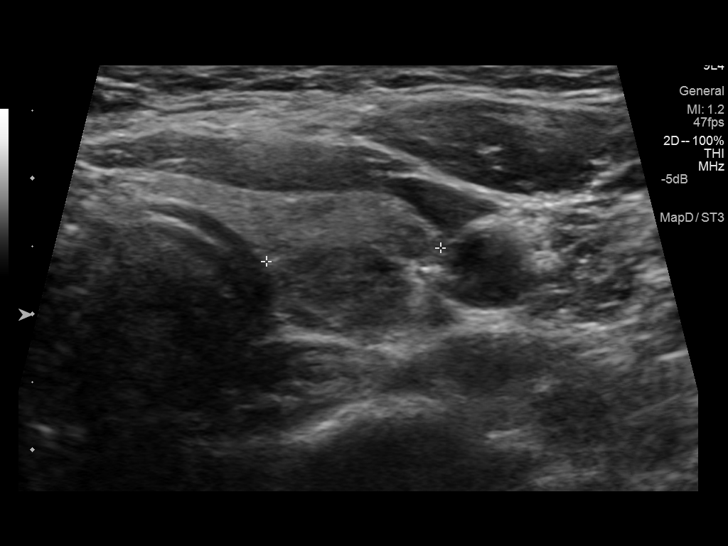
[im 57/63]
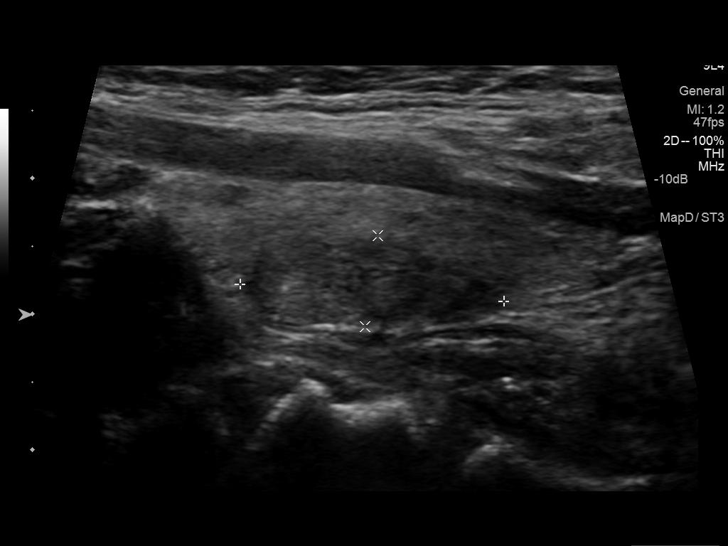
[im 63/63]
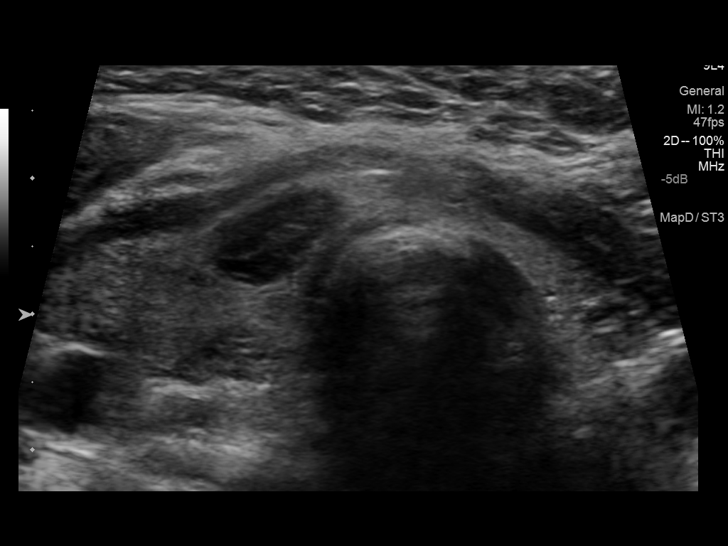

[13 of 25 positions shown; findings below may reference images not displayed]

FINDINGS: Right thyroid lobe

Measurements: 4.7 x 1.7 x 1.9 cm. There are 4 nodules in the right
lobe, measuring 1.0 x 0.7 x 1.2 cm and 0.8 x 0.4 x 0.7 cm in the
upper pole, solid but hypoechoic, and 0.7 x 0.4 x 0.5 cm mixed solid
and cystic and 0.9 x 0.5 x 0.9 cm in the lower pole, solid.

Left thyroid lobe

Measurements: 3.8 x 1.1 x 1.3 cm. 0.9 x 0.7 x 0.8 cm solid
hypoechoic nodule in the upper pole. 2.0 x 0.7 x 1.7 cm solid
hypoechoic nodule posteriorly in the lower pole and 0.5 x 0.3 x
cm solid hypoechoic nodule in the tip of the lower pole.

Isthmus

Thickness: 0.4 cm. Solid 1.4 x 0.5 x 1.1 cm nodule in the right side
of the isthmus and 0.7 x 0.4 x 0.6 cm slightly hypoechoic solid
nodule in the mid isthmus.

Lymphadenopathy

None visualized.

The gland is hypervascular.
IMPRESSION: Multinodular goiter. The dominant nodule in the left lobe meets
criteria for fine needle aspiration biopsy if not previously
assessed. Findings meet consensus criteria for biopsy.
Ultrasound-guided fine needle aspiration should be considered, as
per the consensus statement: Management of Thyroid Nodules Detected
at US: Society of Radiologists in Ultrasound Consensus Conference

## 2015-07-13 IMAGING — CR DG CHEST 2V
2 series · 2 of 2 positions shown · non-contrast
Comparison: Chest radiograph 07/02/2011 and CT from 07/08/2006

CLINICAL DATA: No chest complaints. History of smoking and
diabetes.

EXAM:
CHEST  2 VIEW

[view not recorded (1 of 2)]
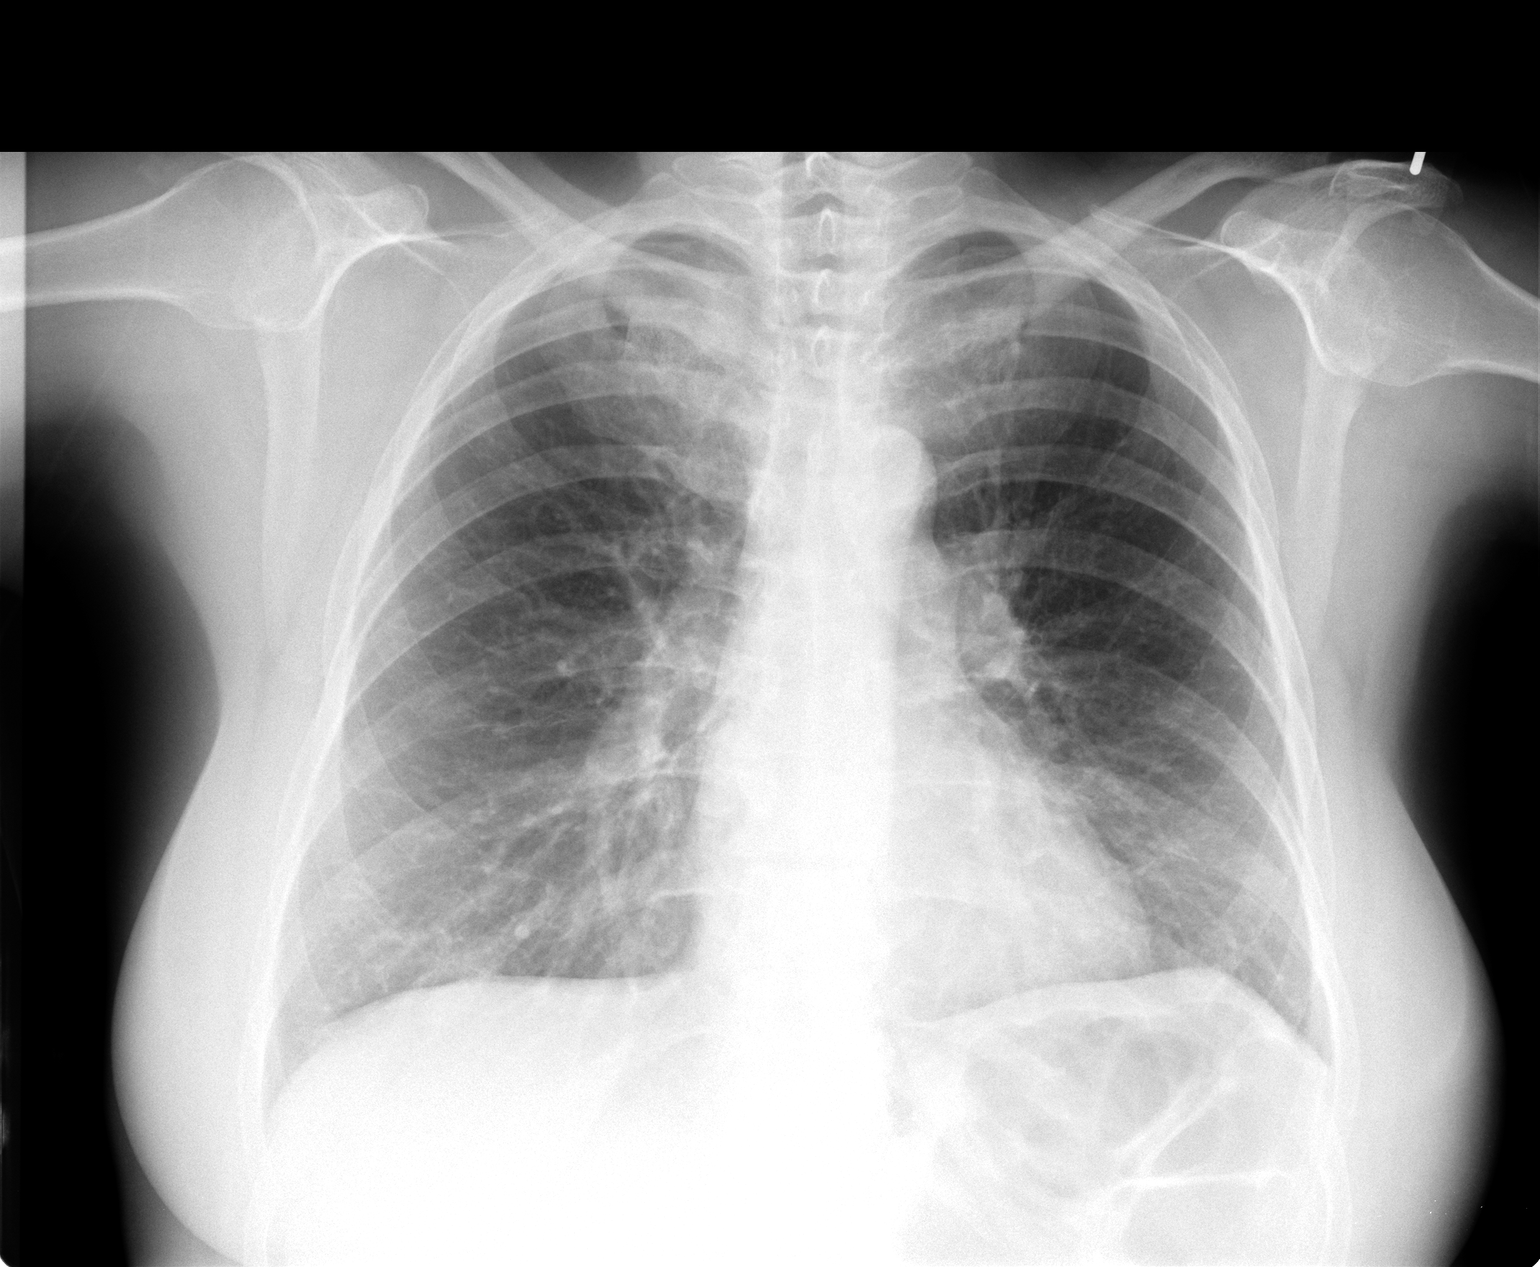

[view not recorded (2 of 2)]
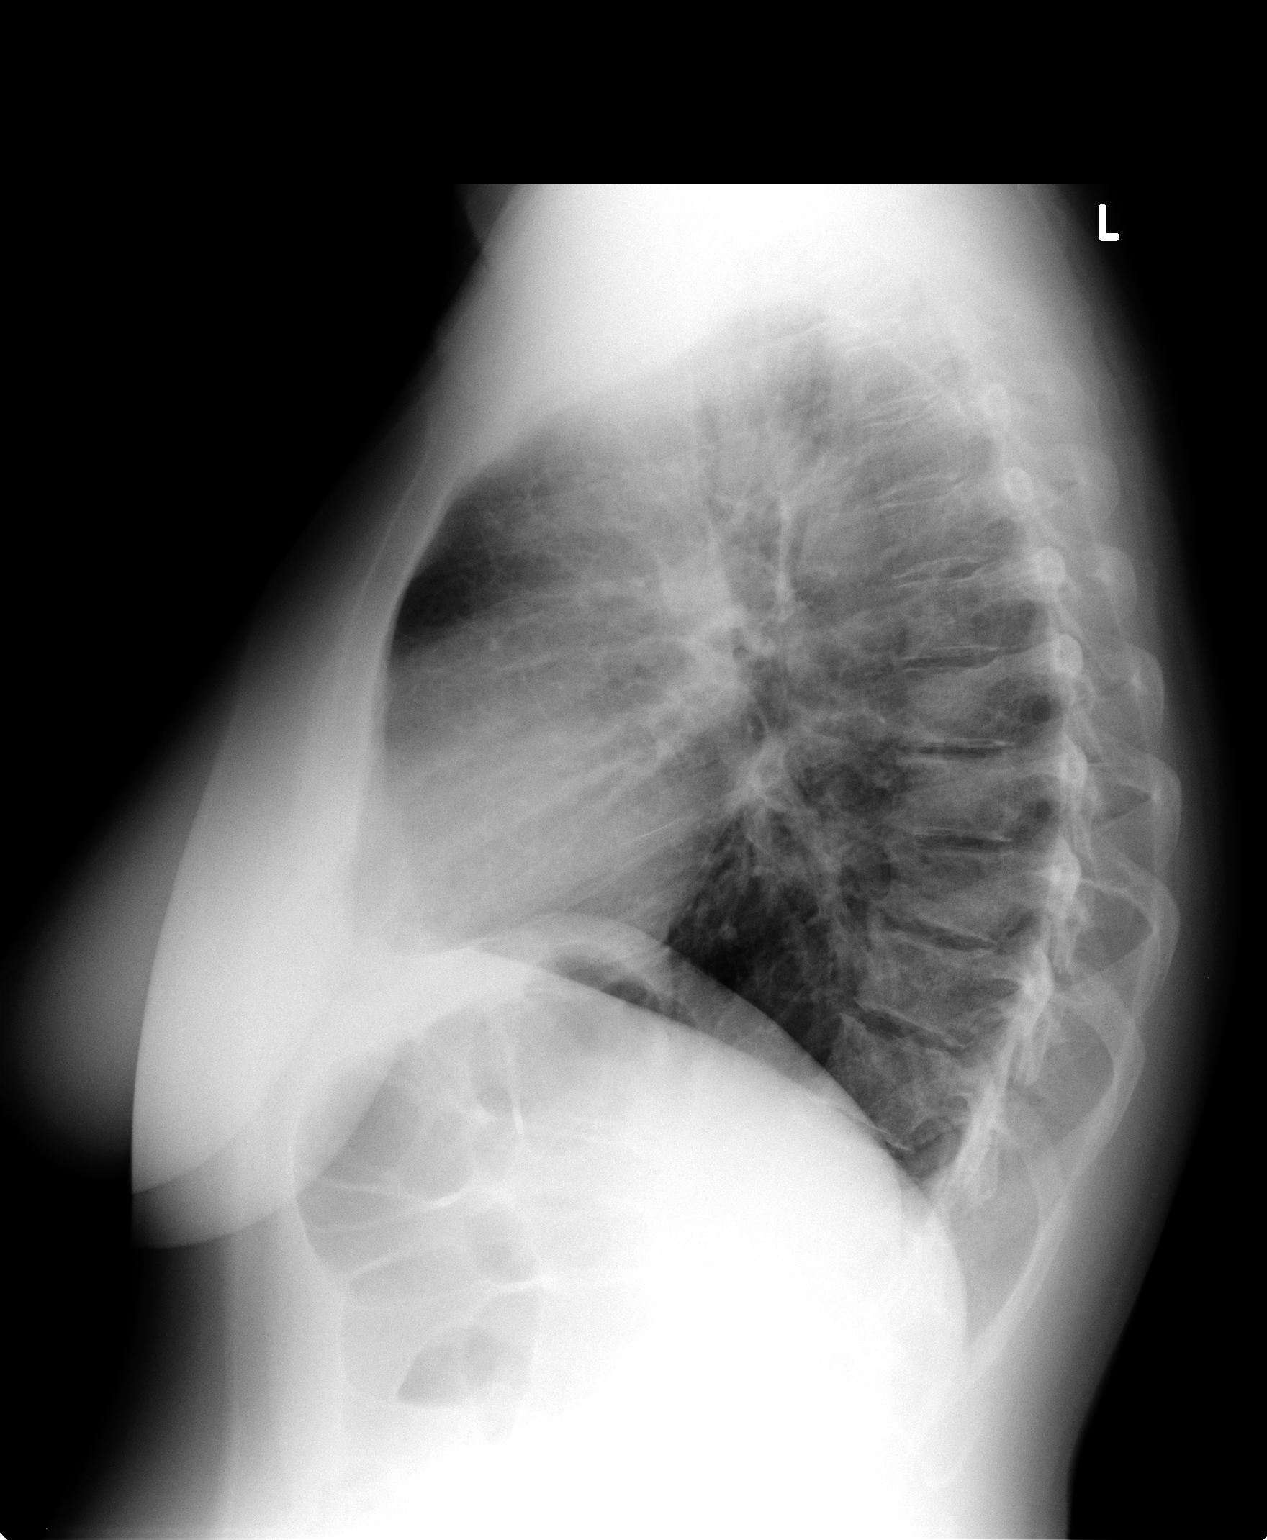

[2 of 2 positions shown; findings below may reference images not displayed]

FINDINGS: The bony structures in the upper ribs and sternum remain prominent
and sclerotic. Again noted is some sclerosis in the mid thoracic
vertebral bodies. These findings are similar to the prior
examination and could be related to Paget's disease. Prominent lung
markings appear to be chronic. No focal airspace disease or edema.
Heart and mediastinum are within normal limits.
IMPRESSION: Chronic changes without acute findings.

## 2015-07-20 IMAGING — US US THYROID BIOPSY
1 series · 12 of 12 positions shown · non-contrast
Comparison: None.

CLINICAL DATA: Dominant left lobe nodule

EXAM:
ULTRASOUND GUIDED NEEDLE ASPIRATE BIOPSY OF THE THYROID GLAND

[Series 1: us thyroid biopsy · 0.05mm/px · 12 acquisitions, 12 frames shown]
[im 1/12]
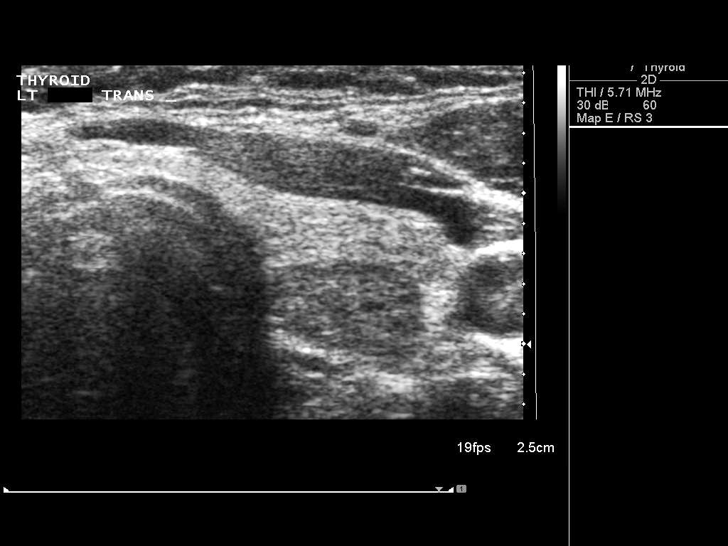
[im 2/12]
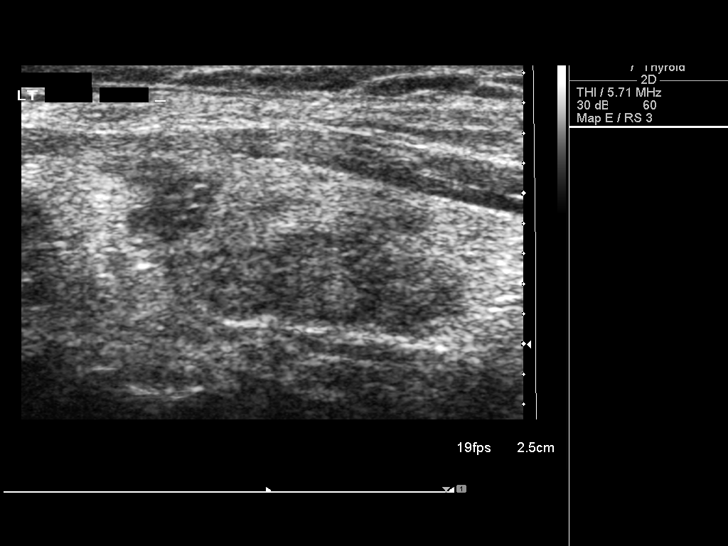
[im 3/12]
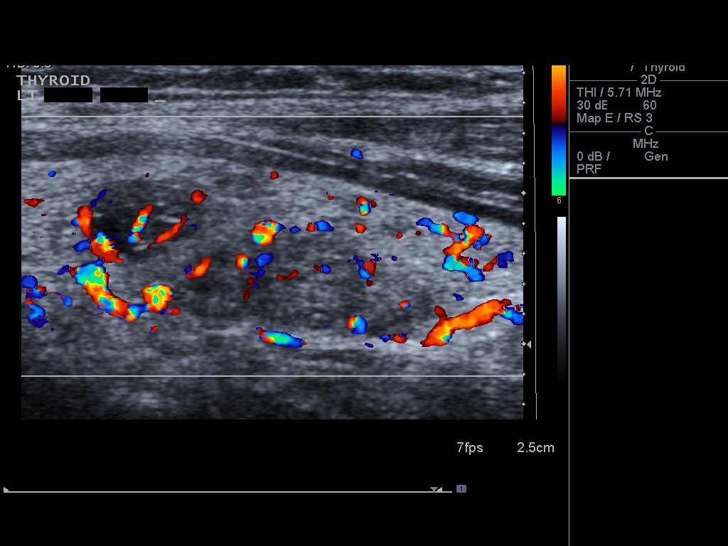
[im 4/12]
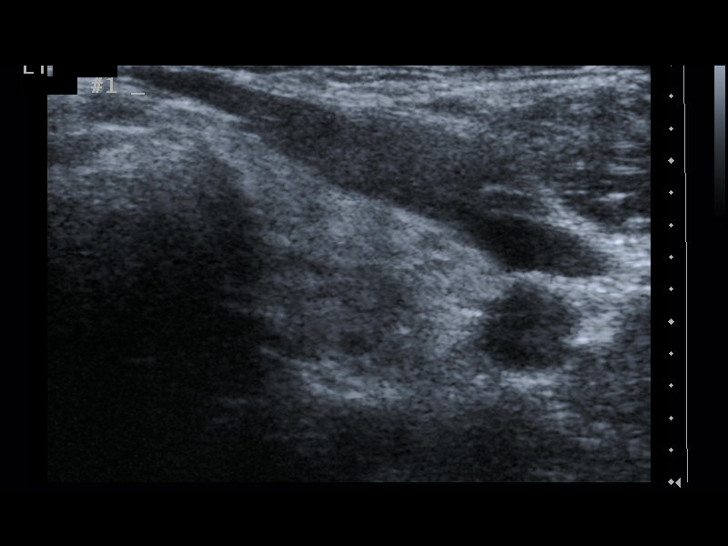
[im 5/12]
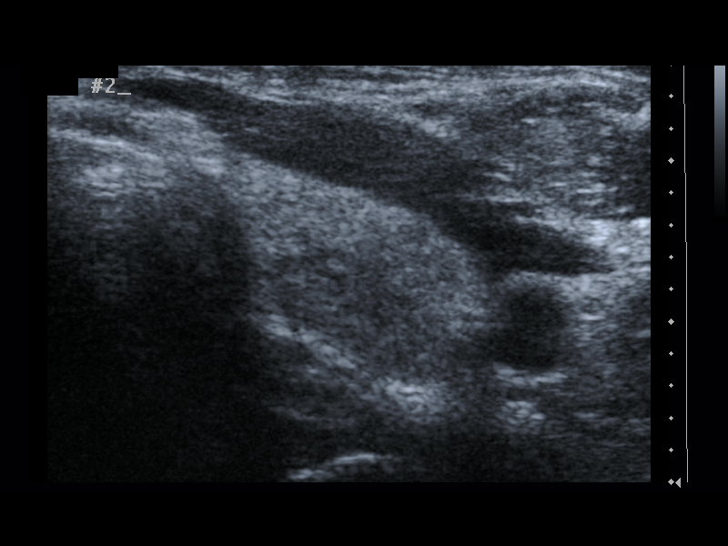
[im 6/12]
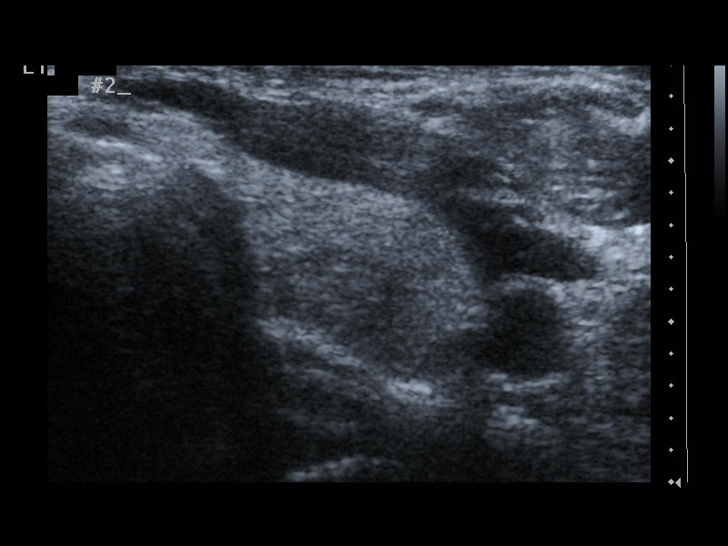
[im 7/12]
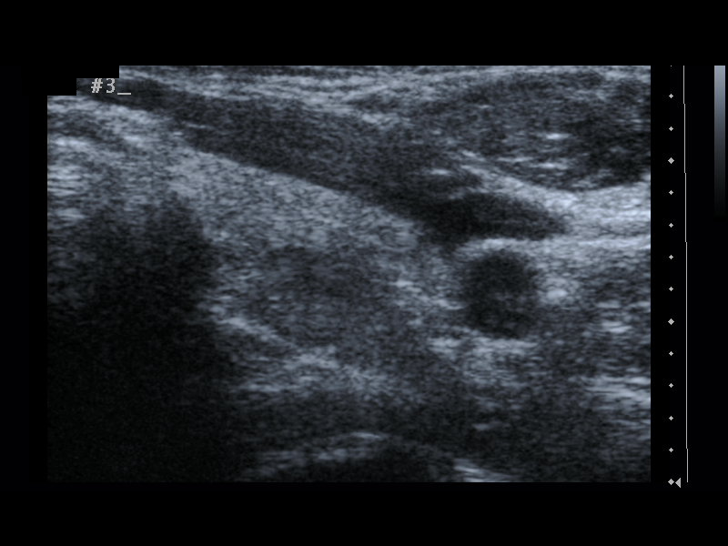
[im 8/12]
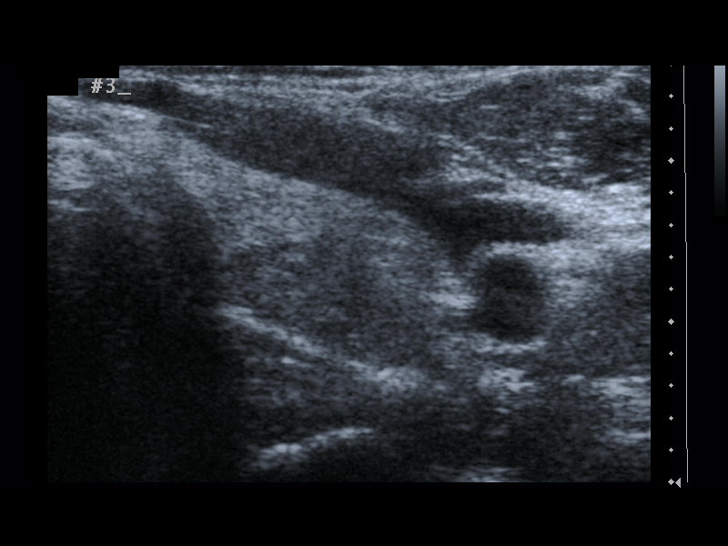
[im 9/12]
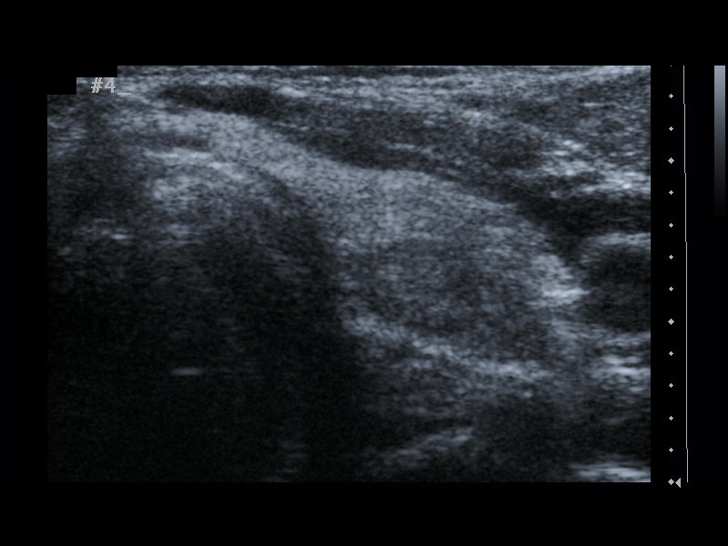
[im 10/12]
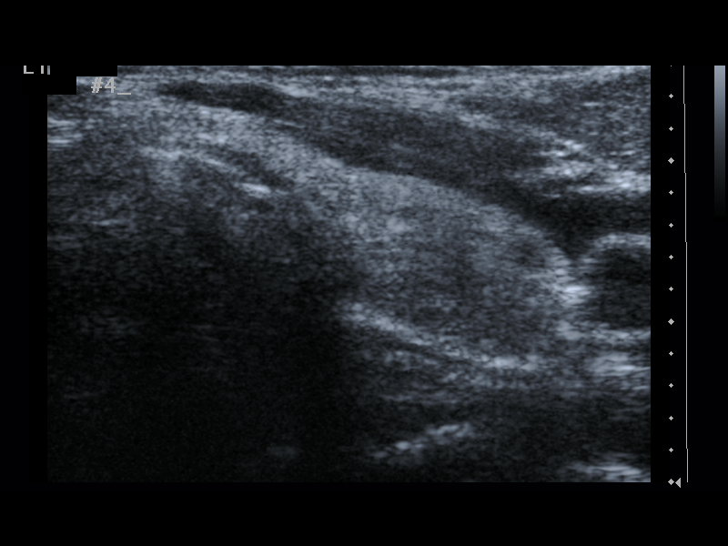
[im 11/12]
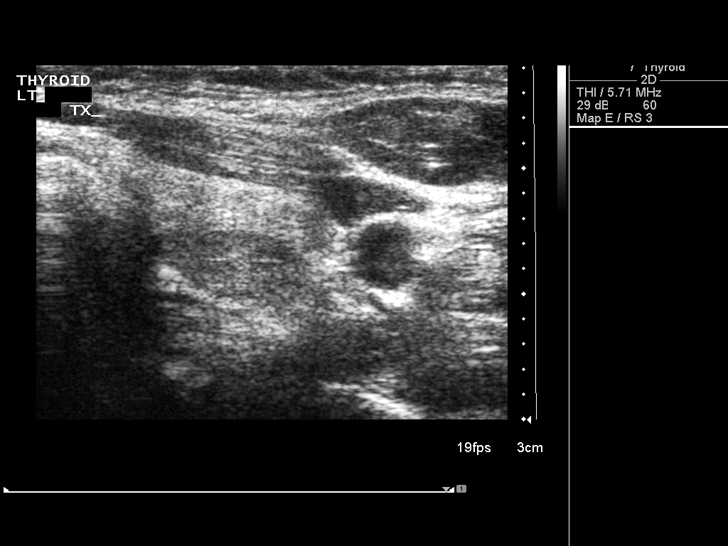
[im 12/12]
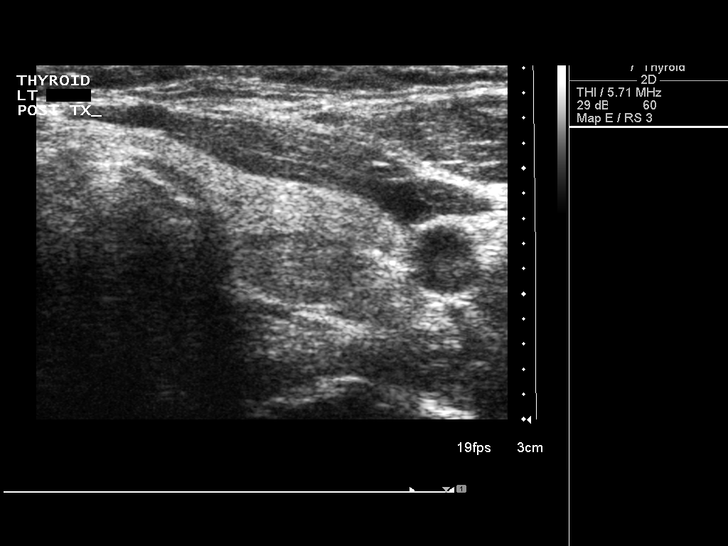

[12 of 12 positions shown; findings below may reference images not displayed]

PROCEDURE:
Thyroid biopsy was thoroughly discussed with the patient and
questions were answered. The benefits, risks, alternatives, and
complications were also discussed. The patient understands and
wishes to proceed with the procedure. Written consent was obtained.



Complications:  None.
FINDINGS: Images document needle placement in the dominant left thyroid
nodule.
IMPRESSION: Ultrasound guided needle aspirate biopsy performed of the dominant
left thyroid nodule.

## 2015-08-14 ENCOUNTER — Other Ambulatory Visit: Payer: Self-pay

## 2015-08-14 MED ORDER — GLIMEPIRIDE 2 MG PO TABS: 2.0000 mg | ORAL_TABLET | Freq: Every day | ORAL | Status: DC

## 2015-08-29 ENCOUNTER — Other Ambulatory Visit: Payer: Self-pay

## 2015-08-29 MED ORDER — ROSUVASTATIN CALCIUM 40 MG PO TABS: 40.0000 mg | ORAL_TABLET | Freq: Every day | ORAL | Status: DC

## 2015-11-03 ENCOUNTER — Other Ambulatory Visit: Payer: Self-pay | Admitting: Internal Medicine

## 2015-11-27 ENCOUNTER — Other Ambulatory Visit: Payer: Self-pay | Admitting: Cardiology

## 2016-01-12 ENCOUNTER — Encounter: Payer: Self-pay | Admitting: Physician Assistant

## 2016-01-25 NOTE — Progress Notes (Signed)
Cardiology Office Note:    Date:  01/26/2016   ID:  Monica Michael, DOB December 07, 1960, MRN [id number]  PCP:  [name], MD  Cardiologist:  [name] >> [name]   Electrophysiologist:  n/a  Referring MD: [name], MD   Chief Complaint  Patient presents with  . Follow-up    CAD, carotid artery disease    History of Present Illness:    Monica Michael is a 55 y.o. female with a hx of CAD s/p NSTEMI in 2013, DM, HL, tobacco abuse.   She was admitted in 2/13 for an NSTEMI.  LHC 07/03/11: Proximal LAD 50-70%, circumflex 95% mid, ostial RCA 50% EF 60%.  She underwent PCI with Resolute DES to the mid circumflex.  Last Carotid US was done in 1/15 and demonstrated stable bilateral plaque with 1-39% stenosis.  Last seen by [name] in 6/16 and FU was recommended with [name].   Returns for FU.  Since last seen, she has been doing well.  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 She is still looking for employment.  She has been diagnosed with pustular psoriasis and it mainly effect the palms of her hands and soles of her feet.  She has not been as active as she would like to be due to the pain in her feet. She notes some dependent edema.  She denies chest pain, syncope, significant dyspnea, orthopnea, PND.    Prior CV studies that were reviewed today include:    Carotid US 1/15 Bilat ICA 1-39%; abnormal thyroid appearance  Past Medical History:  Diagnosis Date  . Bronchitis    February, 2013  . CAD in native artery Jan '13   NSTEMI 07/2011: LHC 07/03/11: Proximal LAD 50-70%, circumflex 95% mid, ostial RCA 50% EF 60%.  PCI: Resolute DES to the mid circumflex  . Carotid artery disease ([place])    Doppler, December, 2013, [id number] R. ICA, 123456 LICA  . Ejection fraction    EF 60%, catheterization, February, 2013  . HTN (hypertension)   . Hyperlipidemia   . Myocardial infarction ([place])    07/2011  . Other disorders of bone and  cartilage(733.99)   . Paget's disease of bone    Noted on chest CT scan  . Tobacco abuse   . Type II or unspecified type diabetes mellitus without mention of complication, uncontrolled   . UTI (urinary tract infection) 03/25/13    Past Surgical History:  Procedure Laterality Date  . ABDOMINAL HYSTERECTOMY  2001  . CORONARY ANGIOPLASTY WITH STENT PLACEMENT  07/2011   1 stent  . LEFT HEART CATHETERIZATION WITH CORONARY ANGIOGRAM N/A 07/03/2011   Procedure: LEFT HEART CATHETERIZATION WITH CORONARY ANGIOGRAM;  Surgeon: [name], MD;  Location[facility] C[facility] LAB;  Service: Cardiovascular;  Laterality: N/A;  . PERCUTANEOUS CORONARY STENT INTERVENTION (PCI-S) Right 07/03/2011   Procedure: PERCUTANEOUS CORONARY STENT INTERVENTION (PCI-S);  Surgeon: [name], MD;  Location: [facility] CATH LAB;  Service: Cardiovascular;  Laterality: Right;    Current Medications: Outpatient Medications Prior to Visit  Medication Sig Dispense Refill  . aspirin 81 MG tablet Take 81 mg by mouth daily.    Marland Kitchen glimepiride (AMARYL) 2 MG tablet TAKE 1 TABLET DAILY BEFORE BREAKFAST 90 tablet 0  . nitroGLYCERIN (NITROSTAT) 0.4 MG SL tablet Place 1 tablet (0.4 mg total) under the tongue every 5 (five) minutes as needed for chest pain. 25 tablet 3  .  rosuvastatin (CRESTOR) 40 MG tablet Take 1 tablet (40 mg total) by mouth daily. Please call and schedule an appointment 90 tablet 0  . canagliflozin (INVOKANA) 100 MG TABS tablet Take 1 tablet (100 mg total) by mouth daily. 90 tablet 1  . Wheat Dextrin (BENEFIBER PO) Take by mouth. Take 6 tsp daily     No facility-administered medications prior to visit.       Allergies:   Review of patient's allergies indicates no known allergies.   Social History   Social History  . Marital status: Widowed    Spouse name: N/A  . Number of children: 1  . Years of education: 76   Occupational History  .  [place] History Main Topics  . Smoking status: Former  Smoker    Packs/day: 0.50    Years: 20.00    Types: Cigarettes  . Smokeless tobacco: Never Used     Comment: discussed cessation strategy, Quit date set for 10/5  . Alcohol use 1.2 oz/week    2 Cans of beer per week  . Drug use: No  . Sexual activity: Not Currently   Other Topics Concern  . None   Social History Narrative   [employer]. Married '84-14 yrs; divorced; married '06 - 5 yers/widowed. 1 son - '89. Work-quality reviewer in an office setting.     Family History:  The patient's family history includes Coronary artery disease in her father; Diabetes in her father; Heart failure (age of onset: 28) in her father; Hodgkin's lymphoma (age of onset: 35) in her mother; Hyperlipidemia in her father; Hypertension in her father; Kidney failure in her father.   ROS:   Please see the history of present illness.    ROS All other systems reviewed and are negative.   EKGs/Labs/Other Test Reviewed:    EKG:  EKG is  ordered today.  The ekg ordered today demonstrates NSR, HR 86, normal axis, QTc 476 ms, no significant change when compared to prior tracing  Recent Labs: No results found for requested labs within last 8760 hours.   Recent Lipid Panel    Component Value Date/Time   CHOL 118 11/06/2014 0732   TRIG 154.0 (H) 11/06/2014 0732   TRIG 147 06/03/2006 0723   HDL 43.60 11/06/2014 0732   CHOLHDL 3 11/06/2014 0732   VLDL 30.8 11/06/2014 0732   LDLCALC 44 11/06/2014 0732   LDLDIRECT 46.6 02/15/2012 1715     Physical Exam:    VS:  BP 130/78   Pulse 86   Ht 5\' 4"  (1.626 m)   Wt 205 lb 6.4 oz (93.2 kg)   BMI 35.26 kg/m     Wt Readings from Last 3 Encounters:  01/26/16 205 lb 6.4 oz (93.2 kg)  11/04/14 184 lb (83.5 kg)  10/30/14 185 lb 3.2 oz (84 kg)     Physical Exam  Constitutional: She is oriented to person, place, and time. She appears well-developed and well-nourished. No distress.  HENT:  Head: Normocephalic and atraumatic.  Eyes: No scleral icterus.  Neck:  Normal range of motion. No JVD present.  Cardiovascular: Normal rate, regular rhythm, S1 normal and S2 normal.  Exam reveals no gallop and no friction rub.   No murmur heard. Pulmonary/Chest: Breath sounds normal. She has no wheezes. She has no rhonchi. She has no rales.  Abdominal: Soft. There is no tenderness.  Musculoskeletal: She exhibits no edema.  Neurological: She is alert and oriented to person, place, and time.  Skin: Skin is warm and dry.  Psychiatric: She has a normal mood and affect.    ASSESSMENT:    1. CAD in native artery   2. Bilateral carotid artery disease ([place])   3. Mixed hyperlipidemia   4. Goiter   5. Bilateral edema of lower extremity    PLAN:    In order of problems listed above:  1. CAD - She has a hx of NSTEMI in 2013 tx with DES to LCx.  She had mod non-obstructive disease in the LAD and RCA that was tx medically.  She is doing well without symptoms of angina.  She will continue ASA, statin.    2. Carotid artery disease - Last [place] in 2015 with mild bilateral plaque. Dr. [name] had planned a FU [place] late last year.  Will make sure FU doppler is arranged.  3. HL - LDL in 6/16 was 44.  She will get FU labs with her PCP next month during her CPE.    4. Goiter - She had FNA done in 2015 that was c/w non-neoplastic goiter.  She had a normal TSH last year.  This is followed by primary care.    5. Edema - She mainly has dependent edema that is likely related to venous insufficiency and inactivity.  I have asked her to try to remain as active as possible and to keep her legs elevated.  She can try compression stockings but may need to cut the feet off if it irritates her psoriasis.     Medication Adjustments/Labs and Tests Ordered: Current medicines are reviewed at length with the patient today.  Concerns regarding medicines are outlined above.  Medication changes, Labs and Tests ordered today are outlined in the Patient Instructions noted below. Patient Instructions    Medication Instructions:  Your physician recommends that you continue on your current medications as directed. Please refer to the Current Medication list given to you today. Labwork: NONE Testing/Procedures: Your physician has requested that you have a carotid duplex. This test is an ultrasound of the carotid arteries in your neck. It looks at blood flow through these arteries that supply the brain with blood. Allow one hour for this exam. There are no restrictions or special instructions. Follow-Up: Your physician wants you to follow-up in: [place] Acie Fredrickson You will receive a reminder letter in the mail two months in advance. If you don't receive a letter, please call our office to schedule the follow-up appointment. Any Other Special Instructions Will Be Listed Below (If Applicable). If you need a refill on your cardiac medications before your next appointment, please call your pharmacy.  Signed, Richardson Dopp, PA-C  01/26/2016 10:58 AM    [facility] [place], [place], [place]  [address] Phone: [phone]; Fax: [phone]

## 2016-01-26 ENCOUNTER — Encounter: Payer: Self-pay | Admitting: Physician Assistant

## 2016-01-26 ENCOUNTER — Ambulatory Visit (INDEPENDENT_AMBULATORY_CARE_PROVIDER_SITE_OTHER): Payer: Managed Care, Other (non HMO) | Admitting: Physician Assistant

## 2016-01-26 VITALS — BP 130/78 | HR 86 | Ht 64.0 in | Wt 205.4 lb

## 2016-01-26 DIAGNOSIS — I779 Disorder of arteries and arterioles, unspecified: Secondary | ICD-10-CM

## 2016-01-26 DIAGNOSIS — R600 Localized edema: Secondary | ICD-10-CM

## 2016-01-26 DIAGNOSIS — I739 Peripheral vascular disease, unspecified: Secondary | ICD-10-CM

## 2016-01-26 DIAGNOSIS — E049 Nontoxic goiter, unspecified: Secondary | ICD-10-CM

## 2016-01-26 DIAGNOSIS — I2510 Atherosclerotic heart disease of native coronary artery without angina pectoris: Secondary | ICD-10-CM | POA: Diagnosis not present

## 2016-01-26 DIAGNOSIS — E782 Mixed hyperlipidemia: Secondary | ICD-10-CM

## 2016-01-26 MED ORDER — NITROGLYCERIN 0.4 MG SL SUBL: 0.4000 mg | SUBLINGUAL_TABLET | SUBLINGUAL | 3 refills | Status: AC | PRN

## 2016-01-26 MED ORDER — ROSUVASTATIN CALCIUM 40 MG PO TABS: 40.0000 mg | ORAL_TABLET | Freq: Every day | ORAL | 3 refills | Status: DC

## 2016-01-26 NOTE — Patient Instructions (Addendum)
Medication Instructions:  Your physician recommends that you continue on your current medications as directed. Please refer to the Current Medication list given to you today. Labwork: NONE Testing/Procedures: Your physician has requested that you have a carotid duplex. This test is an ultrasound of the carotid arteries in your neck. It looks at blood flow through these arteries that supply the brain with blood. Allow one hour for this exam. There are no restrictions or special instructions. Follow-Up: Your physician wants you to follow-up in: [name] Monica Michael You will receive a reminder letter in the mail two months in advance. If you don't receive a letter, please call our office to schedule the follow-up appointment. Any Other Special Instructions Will Be Listed Below (If Applicable). If you need a refill on your cardiac medications before your next appointment, please call your pharmacy.

## 2016-01-28 ENCOUNTER — Ambulatory Visit (HOSPITAL_COMMUNITY)
Admission: RE | Admit: 2016-01-28 | Discharge: 2016-01-28 | Disposition: A | Discharge status: Home / Self Care | Payer: Managed Care, Other (non HMO) | Source: Ambulatory Visit | Attending: Physician Assistant | Admitting: Physician Assistant

## 2016-01-28 DIAGNOSIS — I739 Peripheral vascular disease, unspecified: Secondary | ICD-10-CM

## 2016-01-28 DIAGNOSIS — I779 Disorder of arteries and arterioles, unspecified: Secondary | ICD-10-CM | POA: Diagnosis not present

## 2016-01-28 DIAGNOSIS — I2510 Atherosclerotic heart disease of native coronary artery without angina pectoris: Secondary | ICD-10-CM | POA: Diagnosis not present

## 2016-01-28 DIAGNOSIS — E785 Hyperlipidemia, unspecified: Secondary | ICD-10-CM | POA: Insufficient documentation

## 2016-01-28 DIAGNOSIS — Z720 Tobacco use: Secondary | ICD-10-CM | POA: Diagnosis not present

## 2016-01-28 DIAGNOSIS — E119 Type 2 diabetes mellitus without complications: Secondary | ICD-10-CM | POA: Diagnosis not present

## 2016-01-28 DIAGNOSIS — I6523 Occlusion and stenosis of bilateral carotid arteries: Secondary | ICD-10-CM | POA: Diagnosis not present

## 2016-01-29 ENCOUNTER — Encounter: Payer: Self-pay | Admitting: Physician Assistant

## 2016-02-03 ENCOUNTER — Encounter: Payer: Self-pay | Admitting: Internal Medicine

## 2016-02-03 DIAGNOSIS — Z0001 Encounter for general adult medical examination with abnormal findings: Secondary | ICD-10-CM

## 2016-02-03 DIAGNOSIS — E119 Type 2 diabetes mellitus without complications: Secondary | ICD-10-CM

## 2016-02-16 ENCOUNTER — Other Ambulatory Visit (INDEPENDENT_AMBULATORY_CARE_PROVIDER_SITE_OTHER): Payer: Managed Care, Other (non HMO)

## 2016-02-16 DIAGNOSIS — E119 Type 2 diabetes mellitus without complications: Secondary | ICD-10-CM

## 2016-02-16 DIAGNOSIS — Z0001 Encounter for general adult medical examination with abnormal findings: Secondary | ICD-10-CM | POA: Diagnosis not present

## 2016-02-16 DIAGNOSIS — R6889 Other general symptoms and signs: Secondary | ICD-10-CM

## 2016-02-16 LAB — HEPATIC FUNCTION PANEL
ALT: 18 U/L (ref 0–35)
AST: 14 U/L (ref 0–37)
Albumin: 3.9 g/dL (ref 3.5–5.2)
Alkaline Phosphatase: 136 U/L — ABNORMAL HIGH (ref 39–117)
BILIRUBIN DIRECT: 0.1 mg/dL (ref 0.0–0.3)
BILIRUBIN TOTAL: 0.5 mg/dL (ref 0.2–1.2)
Total Protein: 7.5 g/dL (ref 6.0–8.3)

## 2016-02-16 LAB — URINALYSIS, ROUTINE W REFLEX MICROSCOPIC
Bilirubin Urine: NEGATIVE
KETONES UR: NEGATIVE
Leukocytes, UA: NEGATIVE
Nitrite: NEGATIVE
SPECIFIC GRAVITY, URINE: 1.025 (ref 1.000–1.030)
Total Protein, Urine: 100 — AB
UROBILINOGEN UA: 0.2 (ref 0.0–1.0)
Urine Glucose: >=1000 — AB
pH: 5.5 (ref 5.0–8.0)

## 2016-02-16 LAB — LIPID PANEL
CHOL/HDL RATIO: 4
Cholesterol: 112 mg/dL (ref 0–200)
HDL: 31.7 mg/dL — AB (ref >39.00)
LDL Cholesterol: 52 mg/dL (ref 0–99)
NONHDL: 80.2
Triglycerides: 139 mg/dL (ref 0.0–149.0)
VLDL: 27.8 mg/dL (ref 0.0–40.0)

## 2016-02-16 LAB — CBC WITH DIFFERENTIAL/PLATELET
BASOS PCT: 0.3 % (ref 0.0–3.0)
Basophils Absolute: 0 10*3/uL (ref 0.0–0.1)
Eosinophils Absolute: 0.2 10*3/uL (ref 0.0–0.7)
Eosinophils Relative: 2.3 % (ref 0.0–5.0)
HEMATOCRIT: 43 % (ref 36.0–46.0)
Hemoglobin: 14.4 g/dL (ref 12.0–15.0)
LYMPHS PCT: 27 % (ref 12.0–46.0)
Lymphs Abs: 1.9 10*3/uL (ref 0.7–4.0)
MCHC: 33.4 g/dL (ref 30.0–36.0)
MCV: 83.7 fl (ref 78.0–100.0)
MONOS PCT: 8.2 % (ref 3.0–12.0)
Monocytes Absolute: 0.6 10*3/uL (ref 0.1–1.0)
NEUTROS ABS: 4.5 10*3/uL (ref 1.4–7.7)
Neutrophils Relative %: 62.2 % (ref 43.0–77.0)
PLATELETS: 252 10*3/uL (ref 150.0–400.0)
RBC: 5.13 Mil/uL — ABNORMAL HIGH (ref 3.87–5.11)
RDW: 17.1 % — AB (ref 11.5–15.5)
WBC: 7.2 10*3/uL (ref 4.0–10.5)

## 2016-02-16 LAB — BASIC METABOLIC PANEL
BUN: 16 mg/dL (ref 6–23)
CALCIUM: 8.6 mg/dL (ref 8.4–10.5)
CO2: 28 mEq/L (ref 19–32)
CREATININE: 0.96 mg/dL (ref 0.40–1.20)
Chloride: 103 mEq/L (ref 96–112)
GFR: 63.79 mL/min (ref >60.00)
Glucose, Bld: 313 mg/dL — ABNORMAL HIGH (ref 70–99)
Potassium: 4.1 mEq/L (ref 3.5–5.1)
Sodium: 136 mEq/L (ref 135–145)

## 2016-02-16 LAB — MICROALBUMIN / CREATININE URINE RATIO
Creatinine,U: 90.3 mg/dL
MICROALB/CREAT RATIO: 36.1 mg/g — AB (ref 0.0–30.0)
Microalb, Ur: 32.6 mg/dL — ABNORMAL HIGH (ref 0.0–1.9)

## 2016-02-16 LAB — HEMOGLOBIN A1C: Hgb A1c MFr Bld: 12.3 % — ABNORMAL HIGH (ref 4.6–6.5)

## 2016-02-16 LAB — TSH: TSH: 0.68 u[IU]/mL (ref 0.35–4.50)

## 2016-02-18 ENCOUNTER — Ambulatory Visit (INDEPENDENT_AMBULATORY_CARE_PROVIDER_SITE_OTHER): Payer: Managed Care, Other (non HMO) | Admitting: Internal Medicine

## 2016-02-18 ENCOUNTER — Encounter: Payer: Self-pay | Admitting: Internal Medicine

## 2016-02-18 VITALS — BP 138/82 | HR 78 | Temp 97.7°F | Resp 20 | Wt 205.0 lb

## 2016-02-18 DIAGNOSIS — Z1159 Encounter for screening for other viral diseases: Secondary | ICD-10-CM | POA: Diagnosis not present

## 2016-02-18 DIAGNOSIS — I10 Essential (primary) hypertension: Secondary | ICD-10-CM | POA: Diagnosis not present

## 2016-02-18 DIAGNOSIS — R809 Proteinuria, unspecified: Secondary | ICD-10-CM | POA: Insufficient documentation

## 2016-02-18 DIAGNOSIS — Z1239 Encounter for other screening for malignant neoplasm of breast: Secondary | ICD-10-CM

## 2016-02-18 DIAGNOSIS — E119 Type 2 diabetes mellitus without complications: Secondary | ICD-10-CM

## 2016-02-18 DIAGNOSIS — Z23 Encounter for immunization: Secondary | ICD-10-CM | POA: Diagnosis not present

## 2016-02-18 DIAGNOSIS — Z0001 Encounter for general adult medical examination with abnormal findings: Secondary | ICD-10-CM

## 2016-02-18 DIAGNOSIS — E782 Mixed hyperlipidemia: Secondary | ICD-10-CM | POA: Diagnosis not present

## 2016-02-18 MED ORDER — ROSUVASTATIN CALCIUM 40 MG PO TABS: 40.0000 mg | ORAL_TABLET | Freq: Every day | ORAL | 3 refills | Status: DC

## 2016-02-18 MED ORDER — LOSARTAN POTASSIUM 25 MG PO TABS: 25.0000 mg | ORAL_TABLET | Freq: Every day | ORAL | 3 refills | Status: DC

## 2016-02-18 MED ORDER — GLIPIZIDE ER 10 MG PO TB24: 10.0000 mg | ORAL_TABLET | Freq: Every day | ORAL | 3 refills | Status: DC

## 2016-02-18 MED ORDER — PIOGLITAZONE HCL 45 MG PO TABS: 45.0000 mg | ORAL_TABLET | Freq: Every day | ORAL | 3 refills | Status: DC

## 2016-02-18 NOTE — Progress Notes (Signed)
Subjective:    Patient ID: Monica Michael, female    DOB: 12/14/1960, 55 y.o.   MRN: [id number]  HPI  Here for wellness and f/u;  Overall doing ok;  Pt denies Chest pain, worsening SOB, DOE, wheezing, orthopnea, PND, worsening LE edema, palpitations, dizziness or syncope.  Pt denies neurological change such as new headache, facial or extremity weakness. .  Pt denies worsening depressive symptoms, suicidal ideation or panic. No fever, night sweats, wt loss, loss of appetite, or other constitutional symptoms.  Pt states good ability with ADL's, has low fall risk, home safety reviewed and adequate, no other significant changes in hearing or vision, and only occasionally active with exercise.   Pt denies polydipsia, polyuria, or low sugar symptoms such as weakness or confusion improved with po intake.  Pt states overall good compliance with meds, trying to follow lower cholesterol, diabetic diet, wt overall stable but little exercise however.   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 Insurance is currently expensive and not good coverage, so has to stop the invokana.  Wanting to avoid injectable if possible, had metformin intolerance, and had renal worsening on januvia. Past Medical History:  Diagnosis Date  . Bronchitis    February, 2013  . CAD in native artery Jan '13   NSTEMI 07/2011: LHC 07/03/11: Proximal LAD 50-70%, circumflex 95% mid, ostial RCA 50% EF 60%.  PCI: Resolute DES to the mid circumflex  . Carotid artery disease ([place])    Doppler, December, 2013, [id number] R. ICA, 123456 LICA  //  Carotid US 8/17: Bilateral ICA 1-39% - follow-up when necessary  . Ejection fraction    EF 60%, catheterization, February, 2013  . HTN (hypertension)   . Hyperlipidemia   . Myocardial infarction ([place])    07/2011  . Other disorders of bone and cartilage(733.99)   . Paget's disease of bone    Noted on chest CT scan  . Tobacco abuse   . Type II or unspecified  type diabetes mellitus without mention of complication, uncontrolled   . UTI (urinary tract infection) 03/25/13   Past Surgical History:  Procedure Laterality Date  . ABDOMINAL HYSTERECTOMY  2001  . CORONARY ANGIOPLASTY WITH STENT PLACEMENT  07/2011   1 stent  . LEFT HEART CATHETERIZATION WITH CORONARY ANGIOGRAM N/A 07/03/2011   Procedure: LEFT HEART CATHETERIZATION WITH CORONARY ANGIOGRAM;  Surgeon: [name], MD;  Location[facility] CATH LAB;  Service: Cardiovascular;  Laterality: N/A;  . PERCUTANEOUS CORONARY STENT INTERVENTION (PCI-S) Right 07/03/2011   Procedure: PERCUTANEOUS CORONARY STENT INTERVENTION (PCI-S);  Surgeon: [name], MD;  Location: [facility] CATH LAB;  Service: Cardiovascular;  Laterality: Right;    reports that she has quit smoking. Her smoking use included Cigarettes. She has a 10.00 pack-year smoking history. She has never used smokeless tobacco. She reports that she drinks about 1.2 oz of alcohol per week . She reports that she does not use drugs. family history includes Coronary artery disease in her father; Diabetes in her father; Heart failure (age of onset: 44) in her father; Hodgkin's lymphoma (age of onset: 60) in her mother; Hyperlipidemia in her father; Hypertension in her father; Kidney failure in her father. Allergies  Allergen Reactions  . Januvia [Sitagliptin] Other (See Comments)    Renal worsening  . Metformin And Related Other (See Comments)    GI upset   Current Outpatient Prescriptions on File Prior to Visit  Medication Sig  Dispense Refill  . aspirin 81 MG tablet Take 81 mg by mouth daily.    . nitroGLYCERIN (NITROSTAT) 0.4 MG SL tablet Place 1 tablet (0.4 mg total) under the tongue every 5 (five) minutes as needed for chest pain. 25 tablet 3  . [DISCONTINUED] lisinopril (PRINIVIL,ZESTRIL) 10 MG tablet Take 0.5 tablets (5 mg total) by mouth daily. 30 tablet 6   No current facility-administered medications on file prior to visit.     Review of  Systems  Constitutional: Negative for increased diaphoresis, or other activity, appetite or siginficant weight change other than noted HENT: Negative for worsening hearing loss, ear pain, facial swelling, mouth sores and neck stiffness.   Eyes: Negative for other worsening pain, redness or visual disturbance.  Respiratory: Negative for choking or stridor Cardiovascular: Negative for other chest pain and palpitations.  Gastrointestinal: Negative for worsening diarrhea, blood in stool, or abdominal distention Genitourinary: Negative for hematuria, flank pain or change in urine volume.  Musculoskeletal: Negative for myalgias or other joint complaints.  Skin: Negative for other color change and wound or drainage.  Neurological: Negative for syncope and numbness. other than noted Hematological: Negative for adenopathy. or other swelling Psychiatric/Behavioral: Negative for hallucinations, SI, self-injury, decreased concentration or other worsening agitation.       Objective:   Physical Exam BP 138/82   Pulse 78   Temp 97.7 F (36.5 C) (Oral)   Resp 20   Wt 205 lb (93 kg)   SpO2 98%   BMI 35.19 kg/m  VS noted,  Constitutional: Pt is oriented to person, place, and time. Appears well-developed and well-nourished, in no significant distress Head: Normocephalic and atraumatic  Eyes: Conjunctivae and EOM are normal. Pupils are equal, round, and reactive to light Right Ear: External ear normal.  Left Ear: External ear normal Nose: Nose normal.  Mouth/Throat: Oropharynx is clear and moist  Neck: Normal range of motion. Neck supple. No JVD present. No tracheal deviation present or significant neck LA or mass Cardiovascular: Normal rate, regular rhythm, normal heart sounds and intact distal pulses.   Pulmonary/Chest: Effort normal and breath sounds without rales or wheezing  Abdominal: Soft. Bowel sounds are normal. NT. No HSM  Musculoskeletal: Normal range of motion. Exhibits no  edema Lymphadenopathy: Has no cervical adenopathy.  Neurological: Pt is alert and oriented to person, place, and time. Pt has normal reflexes. No cranial nerve deficit. Motor grossly intact Skin: Skin is warm and dry. No rash noted or new ulcers Psychiatric:  Has mild nervous mood and affect. Behavior is normal.      Assessment & Plan:

## 2016-02-18 NOTE — Progress Notes (Signed)
Pre visit review using our clinic review tool, if applicable. No additional management support is needed unless otherwise documented below in the visit note. 

## 2016-02-18 NOTE — Patient Instructions (Addendum)
You had the Prevnar 13 pneumonia shot today  Ok to stop the glimeparide  Please take all new medication as prescribed - the glipizide ER 10 mg per day, actos 45 mg per day, and losartan 25 mg per day (for protein in urine)  Please continue all other medications as before, and refills have been done if requested.  Please have the pharmacy call with any other refills you may need.  Please continue your efforts at being more active, low cholesterol diet, and weight control.  You are otherwise up to date with prevention measures today.  Please keep your appointments with your specialists as you may have planned  You will be contacted regarding the referral for: Mammogram  Please return in 6 months, or sooner if needed, with Lab testing done 3-5 days before

## 2016-02-21 NOTE — Assessment & Plan Note (Signed)

## 2016-02-21 NOTE — Assessment & Plan Note (Signed)
stable overall by history and exam, recent data reviewed with pt, and pt to continue medical treatment as before,  to f/u any worsening symptoms or concerns Lab Results  Component Value Date   LDLCALC 52 02/16/2016

## 2016-02-21 NOTE — Assessment & Plan Note (Signed)
Mild, for add losartan 25 qd,  to f/u any worsening symptoms or concerns

## 2016-02-21 NOTE — Assessment & Plan Note (Signed)
stable overall by history and exam, recent data reviewed with pt, and pt to continue medical treatment as before,  to f/u any worsening symptoms or concerns BP Readings from Last 3 Encounters:  02/18/16 138/82  01/26/16 130/78  11/04/14 124/72

## 2016-02-21 NOTE — Assessment & Plan Note (Addendum)
Likely severe uncontrolled, not taking meds as prescribed, to avoid metformin due to GI effect,, stop glimeparide, for start glipizide ER 10,  to f/u lab today and with next visit  In addition to the time spent performing CPE, I spent an additional 25 minutes face to face,in which greater than 50% of this time was spent in counseling and coordination of care for patient's acute illness as documented.

## 2016-07-26 ENCOUNTER — Encounter: Payer: Self-pay | Admitting: Cardiovascular Disease

## 2016-07-26 ENCOUNTER — Ambulatory Visit (INDEPENDENT_AMBULATORY_CARE_PROVIDER_SITE_OTHER): Payer: BLUE CROSS/BLUE SHIELD | Admitting: Cardiovascular Disease

## 2016-07-26 VITALS — BP 106/60 | HR 96 | Ht 64.0 in | Wt 228.0 lb

## 2016-07-26 DIAGNOSIS — I779 Disorder of arteries and arterioles, unspecified: Secondary | ICD-10-CM

## 2016-07-26 DIAGNOSIS — I2510 Atherosclerotic heart disease of native coronary artery without angina pectoris: Secondary | ICD-10-CM | POA: Diagnosis not present

## 2016-07-26 DIAGNOSIS — I739 Peripheral vascular disease, unspecified: Principal | ICD-10-CM

## 2016-07-26 NOTE — Progress Notes (Signed)
Cardiology Office Note:    Date:  07/26/2016   ID:  [name], DOB [birth year], MRN [id number]  PCP:  [name], MD  Cardiologist:  [name] >> [name]   Electrophysiologist:  n/a  Referring MD: [name], MD   Chief Complaint  Patient presents with  . Coronary Artery Disease    History of Present Illness:    Monica Michael is a 56 y.o. female with a hx of CAD s/p NSTEMI in 2013, DM, HL, tobacco abuse.   She was admitted in 2/13 for an NSTEMI.  LHC 07/03/11: Proximal LAD 50-70%, circumflex 95% mid, ostial RCA 50% EF 60%.  She underwent PCI with Resolute DES to the mid circumflex.  Last Carotid US was done in 1/15 and demonstrated stable bilateral plaque with 1-39% stenosis.  Last seen by [name] in 6/16 and FU was recommended with [name].   Returns for FU.  Since last seen, she has been doing well.  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 She is still looking for employment.  She has been diagnosed with pustular psoriasis and it mainly effect the palms of her hands and soles of her feet.  She has not been as active as she would like to be due to the pain in her feet. She notes some dependent edema.  She denies chest pain, syncope, significant dyspnea, orthopnea, PND.     Feb. 26, 2018  Pt is seen for the first time today .  Transfer from Dr. [name]. Is having more leg edema     Past Medical History:  Diagnosis Date  . Bronchitis    February, 2013  . CAD in native artery Jan '13   NSTEMI 07/2011: LHC 07/03/11: Proximal LAD 50-70%, circumflex 95% mid, ostial RCA 50% EF 60%.  PCI: Resolute DES to the mid circumflex  . Carotid artery disease ([name])    Doppler, December, 2013, [id number] R. ICA, [id number] LICA  //  Carotid US 8/17: Bilateral ICA 1-39% - follow-up when necessary  . Ejection fraction    EF 60%, catheterization, February, 2013  . HTN (hypertension)   . Hyperlipidemia   . Myocardial infarction    07/2011    . Other disorders of bone and cartilage(733.99)   . Paget's disease of bone    Noted on chest CT scan  . Tobacco abuse   . Type II or unspecified type diabetes mellitus without mention of complication, uncontrolled   . UTI (urinary tract infection) 03/25/13    Past Surgical History:  Procedure Laterality Date  . ABDOMINAL HYSTERECTOMY  2001  . CORONARY ANGIOPLASTY WITH STENT PLACEMENT  07/2011   1 stent  . LEFT HEART CATHETERIZATION WITH CORONARY ANGIOGRAM N/A 07/03/2011   Procedure: LEFT HEART CATHETERIZATION WITH CORONARY ANGIOGRAM;  Surgeon: [name], MD;  Location: [facility] CATH LAB;  Service: Cardiovascular;  Laterality: N/A;  . PERCUTANEOUS CORONARY STENT INTERVENTION (PCI-S) Right 07/03/2011   Procedure: PERCUTANEOUS CORONARY STENT INTERVENTION (PCI-S);  Surgeon: [name], MD;  Location[facility] C[facility] LAB;  Service: Cardiovascular;  Laterality: Right;    Current Medications: Outpatient Medications Prior to Visit  Medication Sig Dispense Refill  . aspirin 81 MG tablet Take 81 mg by mouth daily.    Marland Kitchen glipiZIDE (GLIPIZIDE XL) 10 MG 24 hr tablet Take 1 tablet (10 mg total) by mouth daily with breakfast. 90 tablet 3  . losartan (COZAAR) 25 MG  tablet Take 1 tablet (25 mg total) by mouth daily. 90 tablet 3  . nitroGLYCERIN (NITROSTAT) 0.4 MG SL tablet Place 1 tablet (0.4 mg total) under the tongue every 5 (five) minutes as needed for chest pain. 25 tablet 3  . pioglitazone (ACTOS) 45 MG tablet Take 1 tablet (45 mg total) by mouth daily. 90 tablet 3  . rosuvastatin (CRESTOR) 40 MG tablet Take 1 tablet (40 mg total) by mouth daily. Please call and schedule an appointment 90 tablet 3   No facility-administered medications prior to visit.       Allergies:   Januvia [sitagliptin] and Metformin and related   Social History   Social History  . Marital status: Widowed    Spouse name: N/A  . Number of children: 1  . Years of education: 72   Occupational History  .  [place] History Main Topics  . Smoking status: Former Smoker    Packs/day: 0.50    Years: 20.00    Types: Cigarettes  . Smokeless tobacco: Never Used     Comment: discussed cessation strategy, Quit date set for 10/5  . Alcohol use 1.2 oz/week    2 Cans of beer per week  . Drug use: No  . Sexual activity: Not Currently   Other Topics Concern  . None   Social History Narrative   [employer]. Married '84-14 yrs; divorced; married '06 - 5 yers/widowed. 1 son - '89. Work-quality reviewer in an office setting.     Family History:  The patient's family history includes Coronary artery disease in her father; Diabetes in her father; Heart failure (age of onset: 62) in her father; Hodgkin's lymphoma (age of onset: 32) in her mother; Hyperlipidemia in her father; Hypertension in her father; Kidney failure in her father.   ROS:   Please see the history of present illness.    ROS All other systems reviewed and are negative.     Recent Labs: 02/16/2016: ALT 18; BUN 16; Creatinine, Ser 0.96; Hemoglobin 14.4; Platelets 252.0; Potassium 4.1; Sodium 136; TSH 0.68   Recent Lipid Panel    Component Value Date/Time   CHOL 112 02/16/2016 1624   TRIG 139.0 02/16/2016 1624   TRIG 147 06/03/2006 0723   HDL 31.70 (L) 02/16/2016 1624   CHOLHDL 4 02/16/2016 1624   VLDL 27.8 02/16/2016 1624   LDLCALC 52 02/16/2016 1624   LDLDIRECT 46.6 02/15/2012 1715     Physical Exam:    VS:  BP 106/60 (BP Location: Right Arm, Patient Position: Sitting, Cuff Size: Normal)   Pulse 96   Ht 5\' 4"  (1.626 m)   Wt 228 lb (103.4 kg)   SpO2 98%   BMI 39.14 kg/m     Wt Readings from Last 3 Encounters:  07/26/16 228 lb (103.4 kg)  02/18/16 205 lb (93 kg)  01/26/16 205 lb 6.4 oz (93.2 kg)     Physical Exam  Constitutional: She is oriented to person, place, and time. She appears well-developed and well-nourished. No distress.  HENT:  Head: Normocephalic and atraumatic.  Eyes: No scleral icterus.    Neck: Normal range of motion. No JVD present.  Cardiovascular: Normal rate, regular rhythm, S1 normal and S2 normal.  Exam reveals no gallop and no friction rub.   No murmur heard. Pulmonary/Chest: Breath sounds normal. She has no wheezes. She has no rhonchi. She has no rales.  Abdominal: Soft. There is no tenderness.  Musculoskeletal: She exhibits no edema.  Neurological:  She is alert and oriented to person, place, and time.  Skin: Skin is warm and dry.  Psychiatric: She has a normal mood and affect.    ASSESSMENT:    No diagnosis found. PLAN:    In order of problems listed above:  1. CAD - She has a hx of NSTEMI in 2013 tx with DES to LCx.  She had mod non-obstructive disease in the LAD and RCA that was tx medically.  She is doing well without symptoms of angina.  She will continue ASA, statin.    2. Carotid artery disease - Last [place] in 2017 with mild bilateral plaque.   Will check again in 1 year   3. HL - LDL in 6/16 was 44.  She will get FU labs with her PCP next month during her CPE.    4. Goiter - She had FNA done in 2015 that was c/w non-neoplastic goiter.  She had a normal TSH last year.  This is followed by primary care.    5. Edema - She mainly has dependent edema that is likely related to venous insufficiency and inactivity.  I have asked her to try to remain as active as possible and to keep her legs elevated.        Mertie Moores, MD  07/26/2016 5:10 PM    [place],  [place],   [id number] Pager [phone] Phone: [phone]; Fax: [phone]

## 2016-07-26 NOTE — Patient Instructions (Addendum)
Medication Instructions:  Your physician recommends that you continue on your current medications as directed. Please refer to the Current Medication list given to you today.   Labwork: None Ordered   Testing/Procedures: Your physician has requested that you have a carotid duplex in 1 year. This test is an ultrasound of the carotid arteries in your neck. It looks at blood flow through these arteries that supply the brain with blood. Allow one hour for this exam. There are no restrictions or special instructions.   Follow-Up: Your physician wants you to follow-up in: 1 year with Dr. Acie Fredrickson.  You will receive a reminder letter in the mail two months in advance. If you don't receive a letter, please call our office to schedule the follow-up appointment.   If you need a refill on your cardiac medications before your next appointment, please call your pharmacy.   Thank you for choosing [facility]! Christen Bame, RN [phone]

## 2016-08-04 ENCOUNTER — Encounter: Payer: Self-pay | Admitting: Internal Medicine

## 2016-08-04 NOTE — Telephone Encounter (Signed)
Staff to assit pt with appt for DM f/u please -

## 2016-08-06 ENCOUNTER — Ambulatory Visit (INDEPENDENT_AMBULATORY_CARE_PROVIDER_SITE_OTHER): Payer: BLUE CROSS/BLUE SHIELD | Admitting: Internal Medicine

## 2016-08-06 ENCOUNTER — Encounter: Payer: Self-pay | Admitting: Internal Medicine

## 2016-08-06 VITALS — BP 136/82 | HR 86 | Temp 97.6°F | Ht 64.0 in | Wt 229.0 lb

## 2016-08-06 DIAGNOSIS — Z0001 Encounter for general adult medical examination with abnormal findings: Secondary | ICD-10-CM

## 2016-08-06 DIAGNOSIS — E782 Mixed hyperlipidemia: Secondary | ICD-10-CM | POA: Diagnosis not present

## 2016-08-06 DIAGNOSIS — E119 Type 2 diabetes mellitus without complications: Secondary | ICD-10-CM

## 2016-08-06 DIAGNOSIS — I10 Essential (primary) hypertension: Secondary | ICD-10-CM | POA: Diagnosis not present

## 2016-08-06 LAB — POCT GLYCOSYLATED HEMOGLOBIN (HGB A1C): Hemoglobin A1C: 9.7

## 2016-08-06 MED ORDER — DULAGLUTIDE 0.75 MG/0.5ML ~~LOC~~ SOAJ: 0.5000 mL | SUBCUTANEOUS | 11 refills | Status: DC

## 2016-08-06 MED ORDER — CANAGLIFLOZIN 300 MG PO TABS: 300.0000 mg | ORAL_TABLET | Freq: Every day | ORAL | 3 refills | Status: DC

## 2016-08-06 NOTE — Assessment & Plan Note (Signed)
stable overall by history and exam, recent data reviewed with pt, and pt to continue medical treatment as before,  to f/u any worsening symptoms or concerns Lab Results  Component Value Date   LDLCALC 52 02/16/2016  declines f/u today

## 2016-08-06 NOTE — Patient Instructions (Signed)
Please take all new medication as prescribed - the Trulicity weekly injections, and the Invokana  Please have the pharmacist call with alternative if this is not covered with your insurance  Please continue all other medications as before, and refills have been done if requested.  Please have the pharmacy call with any other refills you may need.  Please keep your appointments with your specialists as you may have planned

## 2016-08-06 NOTE — Progress Notes (Signed)
Subjective:    Patient ID: Monica Michael, female    DOB: March 03, 1961, 56 y.o.   MRN: [id number]  HPI  Here to f/u; overall doing ok,  Pt denies chest pain, increasing sob or doe, wheezing, orthopnea, PND, increased LE swelling, palpitations, dizziness or syncope.  Pt denies new neurological symptoms such as new headache, or facial or extremity weakness or numbness.  Pt denies polydipsia, polyuria, or low sugar episode.   Pt denies new neurological symptoms such as new headache, or facial or extremity weakness or numbness.   Pt states overall good compliance with meds, mostly trying to follow appropriate diet, with wt overall stable,  but little exercise however. Used to be 150 lbs approx 3 yrs ago.Stopped smoking, wt increased now, very difficult to lose.CBG's at home have been > 200.  A1c today is elevated to 9.7, though this is an improvement from approx 12 in sept 2017. Wt Readings from Last 3 Encounters:  08/06/16 229 lb (103.9 kg)  07/26/16 228 lb (103.4 kg)  02/18/16 205 lb (93 kg)   Past Medical History:  Diagnosis Date  . Bronchitis    February, 2013  . CAD in native artery Jan '13   NSTEMI 07/2011: LHC 07/03/11: Proximal LAD 50-70%, circumflex 95% mid, ostial RCA 50% EF 60%.  PCI: Resolute DES to the mid circumflex  . Carotid artery disease ([place])    Doppler, December, 2013, 5-78% R. ICA, 46-96% LICA  //  Carotid US 8/17: Bilateral ICA 1-39% - follow-up when necessary  . Ejection fraction    EF 60%, catheterization, February, 2013  . HTN (hypertension)   . Hyperlipidemia   . Myocardial infarction    07/2011  . Other disorders of bone and cartilage(733.99)   . Paget's disease of bone    Noted on chest CT scan  . Tobacco abuse   . Type II or unspecified type diabetes mellitus without mention of complication, uncontrolled   . UTI (urinary tract infection) 03/25/13   Past Surgical History:  Procedure Laterality Date  . ABDOMINAL HYSTERECTOMY  2001  . CORONARY ANGIOPLASTY WITH  STENT PLACEMENT  07/2011   1 stent  . LEFT HEART CATHETERIZATION WITH CORONARY ANGIOGRAM N/A 07/03/2011   Procedure: LEFT HEART CATHETERIZATION WITH CORONARY ANGIOGRAM;  Surgeon: [name], MD;  Location: [facility] CATH LAB;  Service: Cardiovascular;  Laterality: N/A;  . PERCUTANEOUS CORONARY STENT INTERVENTION (PCI-S) Right 07/03/2011   Procedure: PERCUTANEOUS CORONARY STENT INTERVENTION (PCI-S);  Surgeon: [name], MD;  Location: [facility] CATH LAB;  Service: Cardiovascular;  Laterality: Right;    reports that she has quit smoking. Her smoking use included Cigarettes. She has a 10.00 pack-year smoking history. She has never used smokeless tobacco. She reports that she drinks about 1.2 oz of alcohol per week . She reports that she does not use drugs. family history includes Coronary artery disease in her father; Diabetes in her father; Heart failure (age of onset: 55) in her father; Hodgkin's lymphoma (age of onset: 37) in her mother; Hyperlipidemia in her father; Hypertension in her father; Kidney failure in her father. Allergies  Allergen Reactions  . Januvia [Sitagliptin] Other (See Comments)    Renal worsening  . Metformin And Related Other (See Comments)    GI upset   Current Outpatient Prescriptions on File Prior to Visit  Medication Sig Dispense Refill  . aspirin 81 MG tablet Take 81 mg by mouth daily.    Marland Kitchen glipiZIDE (GLIPIZIDE XL) 10 MG 24  hr tablet Take 1 tablet (10 mg total) by mouth daily with breakfast. 90 tablet 3  . losartan (COZAAR) 25 MG tablet Take 1 tablet (25 mg total) by mouth daily. 90 tablet 3  . nitroGLYCERIN (NITROSTAT) 0.4 MG SL tablet Place 1 tablet (0.4 mg total) under the tongue every 5 (five) minutes as needed for chest pain. 25 tablet 3  . pioglitazone (ACTOS) 45 MG tablet Take 1 tablet (45 mg total) by mouth daily. 90 tablet 3  . rosuvastatin (CRESTOR) 40 MG tablet Take 1 tablet (40 mg total) by mouth daily. Please call and schedule an appointment 90 tablet 3  .  [DISCONTINUED] lisinopril (PRINIVIL,ZESTRIL) 10 MG tablet Take 0.5 tablets (5 mg total) by mouth daily. 30 tablet 6   No current facility-administered medications on file prior to visit.    Review of Systems  Constitutional: Negative for unusual diaphoresis or night sweats HENT: Negative for ear swelling or discharge Eyes: Negative for worsening visual haziness  Respiratory: Negative for choking and stridor.   Gastrointestinal: Negative for distension or worsening eructation Genitourinary: Negative for retention or change in urine volume.  Musculoskeletal: Negative for other MSK pain or swelling Skin: Negative for color change and worsening wound Neurological: Negative for tremors and numbness other than noted  Psychiatric/Behavioral: Negative for decreased concentration or agitation other than above   All other system neg per pt    Objective:   Physical Exam BP 136/82   Pulse 86   Temp 97.6 F (36.4 C)   Ht 5\' 4"  (1.626 m)   Wt 229 lb (103.9 kg)   SpO2 99%   BMI 39.31 kg/m  VS noted,  Constitutional: Pt appears in no apparent distress HENT: Head: NCAT.  Right Ear: External ear normal.  Left Ear: External ear normal.  Eyes: . Pupils are equal, round, and reactive to light. Conjunctivae and EOM are normal Neck: Normal range of motion. Neck supple.  Cardiovascular: Normal rate and regular rhythm.   Pulmonary/Chest: Effort normal and breath sounds without rales or wheezing.  Neurological: Pt is alert. Not confused , motor grossly intact Skin: Skin is warm. No rash, no LE edema Psychiatric: Pt behavior is normal. No agitation.  No other exam findings POCT A1C  Order: [id number]  Status:  Final result Visible to patient:  No (Not Released) Dx:  Type 2 diabetes mellitus without comp...   15:47  Hemoglobin A1C 9.7     Specimen Collected: 08/06/16 15:47             Assessment & Plan:

## 2016-08-06 NOTE — Assessment & Plan Note (Signed)
Mod to severe uncontrolled despite good med compliance, admits to not being able to follow close adherence to diet, and gets little to no exercise, refuses insulin, will add trulicity and invokana to current meds, cont efforts at diet and wt loss

## 2016-08-06 NOTE — Assessment & Plan Note (Signed)
stable overall by history and exam, recent data reviewed with pt, and pt to continue medical treatment as before,  to f/u any worsening symptoms or concerns BP Readings from Last 3 Encounters:  08/06/16 136/82  07/26/16 106/60  02/18/16 138/82

## 2016-08-11 ENCOUNTER — Encounter: Payer: Self-pay | Admitting: Internal Medicine

## 2016-08-12 ENCOUNTER — Telehealth: Payer: Self-pay

## 2016-08-12 MED ORDER — ROSUVASTATIN CALCIUM 40 MG PO TABS: 40.0000 mg | ORAL_TABLET | Freq: Every day | ORAL | 3 refills | Status: DC

## 2016-08-12 MED ORDER — LOSARTAN POTASSIUM 25 MG PO TABS: 25.0000 mg | ORAL_TABLET | Freq: Every day | ORAL | 3 refills | Status: DC

## 2016-08-12 MED ORDER — GLIPIZIDE ER 10 MG PO TB24: 10.0000 mg | ORAL_TABLET | Freq: Every day | ORAL | 3 refills | Status: DC

## 2016-08-12 MED ORDER — PIOGLITAZONE HCL 45 MG PO TABS: 45.0000 mg | ORAL_TABLET | Freq: Every day | ORAL | 3 refills | Status: DC

## 2016-08-12 NOTE — Telephone Encounter (Signed)
Informed pt that Trulicity was approved.

## 2016-08-26 ENCOUNTER — Encounter: Payer: Self-pay | Admitting: Internal Medicine

## 2017-02-17 ENCOUNTER — Other Ambulatory Visit (INDEPENDENT_AMBULATORY_CARE_PROVIDER_SITE_OTHER): Payer: BLUE CROSS/BLUE SHIELD

## 2017-02-17 DIAGNOSIS — Z0001 Encounter for general adult medical examination with abnormal findings: Secondary | ICD-10-CM | POA: Diagnosis not present

## 2017-02-17 DIAGNOSIS — E119 Type 2 diabetes mellitus without complications: Secondary | ICD-10-CM

## 2017-02-17 LAB — LIPID PANEL
CHOLESTEROL: 103 mg/dL (ref 0–200)
HDL: 45.2 mg/dL (ref >39.00)
LDL Cholesterol: 34 mg/dL (ref 0–99)
NonHDL: 57.52
TRIGLYCERIDES: 118 mg/dL (ref 0.0–149.0)
Total CHOL/HDL Ratio: 2
VLDL: 23.6 mg/dL (ref 0.0–40.0)

## 2017-02-17 LAB — BASIC METABOLIC PANEL
BUN: 21 mg/dL (ref 6–23)
CHLORIDE: 102 meq/L (ref 96–112)
CO2: 28 mEq/L (ref 19–32)
Calcium: 9.2 mg/dL (ref 8.4–10.5)
Creatinine, Ser: 1.01 mg/dL (ref 0.40–1.20)
GFR: 59.95 mL/min — ABNORMAL LOW (ref >60.00)
GLUCOSE: 130 mg/dL — AB (ref 70–99)
POTASSIUM: 4.1 meq/L (ref 3.5–5.1)
Sodium: 138 mEq/L (ref 135–145)

## 2017-02-17 LAB — CBC WITH DIFFERENTIAL/PLATELET
Basophils Absolute: 0.1 10*3/uL (ref 0.0–0.1)
Basophils Relative: 0.7 % (ref 0.0–3.0)
EOS ABS: 0.2 10*3/uL (ref 0.0–0.7)
Eosinophils Relative: 3 % (ref 0.0–5.0)
HCT: 38.7 % (ref 36.0–46.0)
HEMOGLOBIN: 12.6 g/dL (ref 12.0–15.0)
Lymphocytes Relative: 26.9 % (ref 12.0–46.0)
Lymphs Abs: 1.9 10*3/uL (ref 0.7–4.0)
MCHC: 32.5 g/dL (ref 30.0–36.0)
MCV: 84.2 fl (ref 78.0–100.0)
MONO ABS: 0.6 10*3/uL (ref 0.1–1.0)
Monocytes Relative: 9.4 % (ref 3.0–12.0)
Neutro Abs: 4.1 10*3/uL (ref 1.4–7.7)
Neutrophils Relative %: 60 % (ref 43.0–77.0)
Platelets: 255 10*3/uL (ref 150.0–400.0)
RBC: 4.6 Mil/uL (ref 3.87–5.11)
RDW: 18.3 % — ABNORMAL HIGH (ref 11.5–15.5)
WBC: 6.9 10*3/uL (ref 4.0–10.5)

## 2017-02-17 LAB — HEMOGLOBIN A1C: Hgb A1c MFr Bld: 6.2 % (ref 4.6–6.5)

## 2017-02-17 LAB — MICROALBUMIN / CREATININE URINE RATIO
Creatinine,U: 96.4 mg/dL
MICROALB/CREAT RATIO: 3.1 mg/g (ref 0.0–30.0)
Microalb, Ur: 3 mg/dL — ABNORMAL HIGH (ref 0.0–1.9)

## 2017-02-17 LAB — HEPATIC FUNCTION PANEL
ALBUMIN: 3.9 g/dL (ref 3.5–5.2)
ALT: 11 U/L (ref 0–35)
AST: 14 U/L (ref 0–37)
Alkaline Phosphatase: 78 U/L (ref 39–117)
Bilirubin, Direct: 0.1 mg/dL (ref 0.0–0.3)
TOTAL PROTEIN: 7.3 g/dL (ref 6.0–8.3)
Total Bilirubin: 0.5 mg/dL (ref 0.2–1.2)

## 2017-02-17 LAB — URINALYSIS, ROUTINE W REFLEX MICROSCOPIC
Bilirubin Urine: NEGATIVE
HGB URINE DIPSTICK: NEGATIVE
Ketones, ur: NEGATIVE
Leukocytes, UA: NEGATIVE
Nitrite: POSITIVE — AB
RBC / HPF: NONE SEEN (ref 0–?)
SPECIFIC GRAVITY, URINE: 1.025 (ref 1.000–1.030)
Total Protein, Urine: NEGATIVE
Urine Glucose: >=1000 — AB
Urobilinogen, UA: 0.2 (ref 0.0–1.0)
pH: 5.5 (ref 5.0–8.0)

## 2017-02-17 LAB — TSH: TSH: 1.52 u[IU]/mL (ref 0.35–4.50)

## 2017-02-18 ENCOUNTER — Ambulatory Visit (INDEPENDENT_AMBULATORY_CARE_PROVIDER_SITE_OTHER): Payer: BLUE CROSS/BLUE SHIELD | Admitting: Internal Medicine

## 2017-02-18 ENCOUNTER — Encounter: Payer: Self-pay | Admitting: Internal Medicine

## 2017-02-18 VITALS — BP 116/72 | HR 91 | Temp 98.0°F | Ht 64.0 in | Wt 231.0 lb

## 2017-02-18 DIAGNOSIS — Z1159 Encounter for screening for other viral diseases: Secondary | ICD-10-CM

## 2017-02-18 DIAGNOSIS — Z23 Encounter for immunization: Secondary | ICD-10-CM | POA: Diagnosis not present

## 2017-02-18 DIAGNOSIS — E119 Type 2 diabetes mellitus without complications: Secondary | ICD-10-CM | POA: Diagnosis not present

## 2017-02-18 DIAGNOSIS — R609 Edema, unspecified: Secondary | ICD-10-CM | POA: Diagnosis not present

## 2017-02-18 DIAGNOSIS — Z114 Encounter for screening for human immunodeficiency virus [HIV]: Secondary | ICD-10-CM

## 2017-02-18 DIAGNOSIS — Z0000 Encounter for general adult medical examination without abnormal findings: Secondary | ICD-10-CM

## 2017-02-18 DIAGNOSIS — R600 Localized edema: Secondary | ICD-10-CM

## 2017-02-18 MED ORDER — ZOSTER VAC RECOMB ADJUVANTED 50 MCG/0.5ML IM SUSR: 0.5000 mL | Freq: Once | INTRAMUSCULAR | 1 refills | Status: AC

## 2017-02-18 NOTE — Assessment & Plan Note (Signed)
stable overall by history and exam, recent data reviewed with pt, and pt to continue medical treatment as before,  to f/u any worsening symptoms or concerns Lab Results  Component Value Date   HGBA1C 6.2 02/17/2017

## 2017-02-18 NOTE — Assessment & Plan Note (Signed)

## 2017-02-18 NOTE — Patient Instructions (Addendum)
You had the flu shot today  Please continue all other medications as before, and refills have been done if requested.  Please have the pharmacy call with any other refills you may need.  Please continue your efforts at being more active, low cholesterol diet, and weight control.  You are otherwise up to date with prevention measures today.  Please keep your appointments with your specialists as you may have planned  Please return in 6 months, or sooner if needed, with Lab testing done 3-5 days before  

## 2017-02-18 NOTE — Progress Notes (Signed)
Subjective:    Patient ID: Monica Michael, female    DOB: May 17, 1961, 56 y.o.   MRN: [id number]  HPI  Here for wellness and f/u;  Overall doing ok;  Pt denies Chest pain, worsening SOB, DOE, wheezing, orthopnea, PND, palpitations, dizziness or syncope, though has some trace bilat leg swelling and varicosities on actos.   Pt denies neurological change such as new headache, facial or extremity weakness.  Pt denies polydipsia, polyuria, or low sugar symptoms. Pt states overall good compliance with treatment and medications, good tolerability, and has been trying to follow appropriate diet.  Pt denies worsening depressive symptoms, suicidal ideation or panic. No fever, night sweats, wt loss, loss of appetite, or other constitutional symptoms.  Pt states good ability with ADL's, has low fall risk, home safety reviewed and adequate, no other significant changes in hearing or vision, and only occasionally active with exercise. Plans to be more active but right now is concentrating on PT for her frozen left shoulder, followed per ortho.  No other interval hx. Wt Readings from Last 3 Encounters:  02/18/17 231 lb (104.8 kg)  08/06/16 229 lb (103.9 kg)  07/26/16 228 lb (103.4 kg)   Past Medical History:  Diagnosis Date  . Bronchitis    February, 2013  . CAD in native artery Jan '13   NSTEMI 07/2011: LHC 07/03/11: Proximal LAD 50-70%, circumflex 95% mid, ostial RCA 50% EF 60%.  PCI: Resolute DES to the mid circumflex  . Carotid artery disease ([place])    Doppler, December, 2013, 6-27% R. ICA, 03-50% LICA  //  Carotid US 8/17: Bilateral ICA 1-39% - follow-up when necessary  . Ejection fraction    EF 60%, catheterization, February, 2013  . HTN (hypertension)   . Hyperlipidemia   . Myocardial infarction ([place])    07/2011  . Other disorders of bone and cartilage(733.99)   . Paget's disease of bone    Noted on chest CT scan  . Tobacco abuse   . Type II or unspecified type diabetes mellitus without mention of  complication, uncontrolled   . UTI (urinary tract infection) 03/25/13   Past Surgical History:  Procedure Laterality Date  . ABDOMINAL HYSTERECTOMY  2001  . CORONARY ANGIOPLASTY WITH STENT PLACEMENT  07/2011   1 stent  . LEFT HEART CATHETERIZATION WITH CORONARY ANGIOGRAM N/A 07/03/2011   Procedure: LEFT HEART CATHETERIZATION WITH CORONARY ANGIOGRAM;  Surgeon: [name], MD;  Location: [facility] CATH LAB;  Service: Cardiovascular;  Laterality: N/A;  . PERCUTANEOUS CORONARY STENT INTERVENTION (PCI-S) Right 07/03/2011   Procedure: PERCUTANEOUS CORONARY STENT INTERVENTION (PCI-S);  Surgeon: [name], MD;  Location: [facility] CATH LAB;  Service: Cardiovascular;  Laterality: Right;    reports that she has quit smoking. Her smoking use included Cigarettes. She has a 10.00 pack-year smoking history. She has never used smokeless tobacco. She reports that she drinks about 1.2 oz of alcohol per week . She reports that she does not use drugs. family history includes Coronary artery disease in her father; Diabetes in her father; Heart failure (age of onset: 59) in her father; Hodgkin's lymphoma (age of onset: 48) in her mother; Hyperlipidemia in her father; Hypertension in her father; Kidney failure in her father. Allergies  Allergen Reactions  . Januvia [Sitagliptin] Other (See Comments)    Renal worsening  . Metformin And Related Other (See Comments)    GI upset   Current Outpatient Prescriptions on File Prior to Visit  Medication Sig Dispense Refill  .  aspirin 81 MG tablet Take 81 mg by mouth daily.    . canagliflozin (INVOKANA) 300 MG TABS tablet Take 1 tablet (300 mg total) by mouth daily before breakfast. 90 tablet 3  . Dulaglutide (TRULICITY) 7.51 WC/5.8NI SOPN Inject 0.5 mLs into the skin once a week. 2 mL 11  . glipiZIDE (GLIPIZIDE XL) 10 MG 24 hr tablet Take 1 tablet (10 mg total) by mouth daily with breakfast. 90 tablet 3  . losartan (COZAAR) 25 MG tablet Take 1 tablet (25 mg total) by mouth  daily. 90 tablet 3  . nitroGLYCERIN (NITROSTAT) 0.4 MG SL tablet Place 1 tablet (0.4 mg total) under the tongue every 5 (five) minutes as needed for chest pain. 25 tablet 3  . pioglitazone (ACTOS) 45 MG tablet Take 1 tablet (45 mg total) by mouth daily. 90 tablet 3  . rosuvastatin (CRESTOR) 40 MG tablet Take 1 tablet (40 mg total) by mouth daily. Please call and schedule an appointment 90 tablet 3  . [DISCONTINUED] lisinopril (PRINIVIL,ZESTRIL) 10 MG tablet Take 0.5 tablets (5 mg total) by mouth daily. 30 tablet 6   No current facility-administered medications on file prior to visit.     Review of Systems Constitutional: Negative for other unusual diaphoresis, sweats, appetite or weight changes HENT: Negative for other worsening hearing loss, ear pain, facial swelling, mouth sores or neck stiffness.   Eyes: Negative for other worsening pain, redness or other visual disturbance.  Respiratory: Negative for other stridor or swelling Cardiovascular: Negative for other palpitations or other chest pain  Gastrointestinal: Negative for worsening diarrhea or loose stools, blood in stool, distention or other pain Genitourinary: Negative for hematuria, flank pain or other change in urine volume.  Musculoskeletal: Negative for myalgias or other joint swelling.  Skin: Negative for other color change, or other wound or worsening drainage.  Neurological: Negative for other syncope or numbness. Hematological: Negative for other adenopathy or swelling Psychiatric/Behavioral: Negative for hallucinations, other worsening agitation, SI, self-injury, or new decreased concentration All other system neg per pt    Objective:   Physical Exam BP 116/72   Pulse 91   Temp 98 F (36.7 C) (Oral)   Ht 5\' 4"  (1.626 m)   Wt 231 lb (104.8 kg)   SpO2 99%   BMI 39.65 kg/m   VS noted, morbid obese Constitutional: Pt is oriented to person, place, and time. Appears well-developed and well-nourished, in no significant  distress and comfortable Head: Normocephalic and atraumatic  Eyes: Conjunctivae and EOM are normal. Pupils are equal, round, and reactive to light Right Ear: External ear normal without discharge Left Ear: External ear normal without discharge Nose: Nose without discharge or deformity Mouth/Throat: Oropharynx is without other ulcerations and moist  Neck: Normal range of motion. Neck supple. No JVD present. No tracheal deviation present or significant neck LA or mass Cardiovascular: Normal rate, regular rhythm, normal heart sounds and intact distal pulses.   Pulmonary/Chest: WOB normal and breath sounds without rales or wheezing  Abdominal: Soft. Bowel sounds are normal. NT. No HSM  Musculoskeletal: Normal range of motion. Exhibits trace bilat LE edema Lymphadenopathy: Has no other cervical adenopathy.  Neurological: Pt is alert and oriented to person, place, and time. Pt has normal reflexes. No cranial nerve deficit. Motor grossly intact, Gait intact Skin: Skin is warm and dry. No rash noted or new ulcerations Psychiatric:  Has normal mood and affect. Behavior is normal without agitation No other exam findings    Assessment & Plan:

## 2017-02-18 NOTE — Assessment & Plan Note (Signed)
Trace bilat and o/w asympt, not cardiac related likely, possible venous insufficinecy +/- actos related, ok to follow

## 2017-03-16 ENCOUNTER — Encounter: Payer: Self-pay | Admitting: Internal Medicine

## 2017-03-17 MED ORDER — PIOGLITAZONE HCL 30 MG PO TABS: 30.0000 mg | ORAL_TABLET | Freq: Every day | ORAL | 3 refills | Status: DC

## 2017-07-05 ENCOUNTER — Ambulatory Visit: Payer: BLUE CROSS/BLUE SHIELD | Admitting: Physician Assistant

## 2017-07-05 ENCOUNTER — Encounter: Payer: Self-pay | Admitting: Physician Assistant

## 2017-07-05 VITALS — BP 118/60 | HR 86 | Ht 64.0 in | Wt 235.8 lb

## 2017-07-05 DIAGNOSIS — E782 Mixed hyperlipidemia: Secondary | ICD-10-CM | POA: Diagnosis not present

## 2017-07-05 DIAGNOSIS — R600 Localized edema: Secondary | ICD-10-CM | POA: Diagnosis not present

## 2017-07-05 DIAGNOSIS — I2510 Atherosclerotic heart disease of native coronary artery without angina pectoris: Secondary | ICD-10-CM | POA: Diagnosis not present

## 2017-07-05 DIAGNOSIS — I6523 Occlusion and stenosis of bilateral carotid arteries: Secondary | ICD-10-CM | POA: Diagnosis not present

## 2017-07-05 DIAGNOSIS — E119 Type 2 diabetes mellitus without complications: Secondary | ICD-10-CM | POA: Diagnosis not present

## 2017-07-05 NOTE — Progress Notes (Signed)
Cardiology Office Note:    Date:  07/05/2017   ID:  MERCADIES CO, DOB 1960/08/06, MRN [id number]  PCP:  Monica Borg, MD  Cardiologist:  Monica Moores, MD   Referring MD: Monica Borg, MD   Chief Complaint  Patient presents with  . Follow-up    CAD    History of Present Illness:    Monica Michael is a 57 y.o. female with a hx of coronary artery disease status post non-ST elevation myocardial infarction in 2013, diabetes, hyperlipidemia, tobacco abuse.  She was admitted in February 2013 with a non-ST elevation myocardial infarction.  Cardiac catheterization demonstrated 95% mid left circumflex lesion which was treated with a drug-eluting stent.  She was last seen by Monica Michael 2/18.  [name] returns for cardiology follow-up.  She is here alone.  She is doing well.  He has been walking several times a week.  She denies chest discomfort, shortness of breath, syncope or near syncope.  She does have lower extremity edema.  It seems to be getting worse.  It was improving with lying supine but is now fairly persistent.  Prior CV studies:   The following studies were reviewed today:  Carotid US 8/17 Bilateral ICA 1-39 >> follow-up as needed  Carotid US 1/15 Bilat ICA 1-39%; abnormal thyroid appearance  LHC 07/03/11:  Proximal LAD 50-70%, circumflex 95% mid, ostial RCA 50% EF 60%.   PCI:  Resolute DES to the mid circumflex.   Past Medical History:  Diagnosis Date  . Bronchitis    February, 2013  . CAD in native artery Jan '13   NSTEMI 07/2011: LHC 07/03/11: Proximal LAD 50-70%, circumflex 95% mid, ostial RCA 50% EF 60%.  PCI: Resolute DES to the mid circumflex  . Carotid artery disease ([place])    Doppler, December, 2013, 5-32% R. ICA, 99-24% LICA  //  Carotid US 8/17: Bilateral ICA 1-39% - follow-up when necessary  . Ejection fraction    EF 60%, catheterization, February, 2013  . HTN (hypertension)   . Hyperlipidemia   . Myocardial infarction ([place])    07/2011  . Other disorders  of bone and cartilage(733.99)   . Paget's disease of bone    Noted on chest CT scan  . Tobacco abuse   . Type II or unspecified type diabetes mellitus without mention of complication, uncontrolled   . UTI (urinary tract infection) 03/25/13    Past Surgical History:  Procedure Laterality Date  . ABDOMINAL HYSTERECTOMY  2001  . CORONARY ANGIOPLASTY WITH STENT PLACEMENT  07/2011   1 stent  . LEFT HEART CATHETERIZATION WITH CORONARY ANGIOGRAM N/A 07/03/2011   Procedure: LEFT HEART CATHETERIZATION WITH CORONARY ANGIOGRAM;  Surgeon: Monica Grooms, MD;  Location: [facility] CATH LAB;  Service: Cardiovascular;  Laterality: N/A;  . PERCUTANEOUS CORONARY STENT INTERVENTION (PCI-S) Right 07/03/2011   Procedure: PERCUTANEOUS CORONARY STENT INTERVENTION (PCI-S);  Surgeon: Monica Grooms, MD;  Location: [facility] CATH LAB;  Service: Cardiovascular;  Laterality: Right;    Current Medications: Current Meds  Medication Sig  . aspirin 81 MG tablet Take 81 mg by mouth daily.  . canagliflozin (INVOKANA) 300 MG TABS tablet Take 1 tablet (300 mg total) by mouth daily before breakfast.  . Dulaglutide (TRULICITY) 2.68 TM/1.9QQ SOPN Inject 0.5 mLs into the skin once a week.  Marland Kitchen glipiZIDE (GLIPIZIDE XL) 10 MG 24 hr tablet Take 1 tablet (10 mg total) by mouth daily with breakfast.  . losartan (COZAAR) 25 MG tablet Take  1 tablet (25 mg total) by mouth daily.  . nitroGLYCERIN (NITROSTAT) 0.4 MG SL tablet Place 1 tablet (0.4 mg total) under the tongue every 5 (five) minutes as needed for chest pain.  . pioglitazone (ACTOS) 30 MG tablet Take 1 tablet (30 mg total) by mouth daily.  . rosuvastatin (CRESTOR) 40 MG tablet Take 1 tablet (40 mg total) by mouth daily. Please call and schedule an appointment     Allergies:   Januvia [sitagliptin] and Metformin and related   Social History   Tobacco Use  . Smoking status: Former Smoker    Packs/day: 0.50    Years: 20.00    Pack years: 10.00    Types: Cigarettes  . Smokeless  tobacco: Never Used  . Tobacco comment: discussed cessation strategy, Quit date set for 10/5  Substance Use Topics  . Alcohol use: Yes    Alcohol/week: 1.2 oz    Types: 2 Cans of beer per week  . Drug use: No     Family Hx: The patient's family history includes Coronary artery disease in her father; Diabetes in her father; Heart failure (age of onset: 48) in her father; Hodgkin's lymphoma (age of onset: 84) in her mother; Hyperlipidemia in her father; Hypertension in her father; Kidney failure in her father. There is no history of Cancer, Colon cancer, Rectal cancer, Stomach cancer, or Esophageal cancer.  ROS:   Please see the history of present illness.    Review of Systems  Cardiovascular: Positive for leg swelling.   All other systems reviewed and are negative.   EKGs/Labs/Other Test Reviewed:    EKG:  EKG is  ordered today.  The ekg ordered today demonstrates normal sinus rhythm, heart rate 81, normal axis, QTC 466 ms, no change from prior tracing  Recent Labs: 02/17/2017: ALT 11; BUN 21; Creatinine, Ser 1.01; Hemoglobin 12.6; Platelets 255.0; Potassium 4.1; Sodium 138; TSH 1.52   Recent Lipid Panel Lab Results  Component Value Date/Time   CHOL 103 02/17/2017 08:16 AM   TRIG 118.0 02/17/2017 08:16 AM   TRIG 147 06/03/2006 07:23 AM   HDL 45.20 02/17/2017 08:16 AM   CHOLHDL 2 02/17/2017 08:16 AM   LDLCALC 34 02/17/2017 08:16 AM   LDLDIRECT 46.6 02/15/2012 05:15 PM    Physical Exam:    VS:  BP 118/60   Pulse 86   Ht 5\' 4"  (1.626 m)   Wt 235 lb 12.8 oz (107 kg)   SpO2 96%   BMI 40.47 kg/m     Wt Readings from Last 3 Encounters:  07/05/17 235 lb 12.8 oz (107 kg)  02/18/17 231 lb (104.8 kg)  08/06/16 229 lb (103.9 kg)     Physical Exam  Constitutional: She is oriented to person, place, and time. She appears well-developed and well-nourished. No distress.  HENT:  Head: Normocephalic and atraumatic.  Neck: No JVD present.  Cardiovascular: Normal rate and regular  rhythm.  No murmur heard. Pulmonary/Chest: Effort normal. She has no rales.  Abdominal: Soft.  Musculoskeletal: She exhibits edema (trace-1+ bilat LE edema).  Neurological: She is alert and oriented to person, place, and time.  Skin: Skin is warm and dry.    ASSESSMENT & PLAN:    1.  Coronary artery disease History of non-ST elevation myocardial infarction in 2013 treated with a drug-eluting stent to the left circumflex.  She is doing well without anginal symptoms.  Continue aspirin, statin, ARB.  2.  Bilateral carotid artery stenosis Carotid ultrasound is scheduled in 2 weeks.  3.  Mixed hyperlipidemia LDL optimal on most recent lab work.  Continue current Rx.    4.  Type 2 diabetes mellitus without complication, without long-term current use of insulin ([place]) Continue follow up with primary care.  Given the results of the EMPA-REG OUTCOME Trial, it may be beneficial to change her SGLT-2 inhibitor to Empagliflozin.  She can discuss this further with her primary care provider.  5.  Edema This appears to be related to venous insufficiency.  We discussed keeping her legs elevated, wearing compression stockings and continued increased activity.   Dispo:  Return in about 1 year (around 07/05/2018) for Routine Follow Up, w/ Monica Michael, or Richardson Dopp, PA-C.   Medication Adjustments/Labs and Tests Ordered: Current medicines are reviewed at length with the patient today.  Concerns regarding medicines are outlined above.  Tests Ordered: Orders Placed This Encounter  Procedures  . EKG 12-Lead   Medication Changes: No orders of the defined types were placed in this encounter.   Signed, Richardson Dopp, PA-C  07/05/2017 12:58 PM    [facility] [place], [place], [place]  [address] Phone: [phone]; Fax: [phone]

## 2017-07-05 NOTE — Patient Instructions (Addendum)
Medication Instructions:  No changes. You can talk to Dr. Jenny Reichmann the next time you see to discuss whether or not to switch Invokana to [place].  Labwork: None   Testing/Procedures: Carotid ultrasound is scheduled for 2/26 at 10 am.  Follow-Up: [name] or Richardson Dopp, PA-C in 1 year.   Any Other Special Instructions Will Be Listed Below (If Applicable). Keep your legs elevated when seated. Great job on the walking program.  Keep it up! Try to get some compression stockings over the counter to help with your swelling.  If you need a refill on your cardiac medications before your next appointment, please call your pharmacy.

## 2017-07-20 ENCOUNTER — Other Ambulatory Visit: Payer: Self-pay | Admitting: Internal Medicine

## 2017-07-24 ENCOUNTER — Other Ambulatory Visit: Payer: Self-pay | Admitting: Internal Medicine

## 2017-07-26 ENCOUNTER — Encounter: Payer: Self-pay | Admitting: Internal Medicine

## 2017-07-26 ENCOUNTER — Encounter: Payer: Self-pay | Admitting: Physician Assistant

## 2017-07-26 ENCOUNTER — Ambulatory Visit (HOSPITAL_COMMUNITY)
Admission: RE | Admit: 2017-07-26 | Discharge: 2017-07-26 | Disposition: A | Discharge status: Home / Self Care | Payer: BLUE CROSS/BLUE SHIELD | Source: Ambulatory Visit | Attending: Cardiology | Admitting: Cardiology

## 2017-07-26 DIAGNOSIS — I6522 Occlusion and stenosis of left carotid artery: Secondary | ICD-10-CM | POA: Insufficient documentation

## 2017-07-26 DIAGNOSIS — E042 Nontoxic multinodular goiter: Secondary | ICD-10-CM

## 2017-07-26 DIAGNOSIS — I779 Disorder of arteries and arterioles, unspecified: Secondary | ICD-10-CM | POA: Diagnosis not present

## 2017-07-26 DIAGNOSIS — I739 Peripheral vascular disease, unspecified: Secondary | ICD-10-CM

## 2017-07-27 ENCOUNTER — Telehealth: Payer: Self-pay | Admitting: *Deleted

## 2017-07-27 NOTE — Telephone Encounter (Signed)
-----   Message from Liliane Shi, [place] sent at 07/26/2017  7:41 PM EST ----- Please call the patient. The carotid ultrasound demonstrates just minimal plaque in the L internal carotid artery (LICA).  There is evidence of a multinodular goiter which appears to be known based upon prior ultrasound studies.   Recommend follow up with PCP to review and make further recommendations regarding the multinodular goiter (if any). Please fax a copy of this study result to her PCP:  [name], MD  Thanks! Richardson Dopp, PA-C    07/26/2017 7:36 PM

## 2017-07-27 NOTE — Telephone Encounter (Signed)
Pt due for 6 mo ROV in mar 2019 or sooner  Please contact to assist with making appt - thanks

## 2017-07-27 NOTE — Telephone Encounter (Signed)
Pt has been notified of carotid u/s results by phone with verbal understanding. Pt states she already s/w Dr.John's office as well and schedule an appt. Looks like Dr. Jenny Reichmann has ordered and u/s as well for the pt. Advised pt no changes to be made with medications at this time. Pt is agreeable to plan of care and thanked me for my call.

## 2017-07-27 NOTE — Telephone Encounter (Signed)
Left message to go over carotid results and recommendations.

## 2017-07-27 NOTE — Telephone Encounter (Signed)
Follow up    Patient returning call for results. Please call again

## 2017-08-02 ENCOUNTER — Other Ambulatory Visit (INDEPENDENT_AMBULATORY_CARE_PROVIDER_SITE_OTHER): Payer: BLUE CROSS/BLUE SHIELD

## 2017-08-02 DIAGNOSIS — Z1159 Encounter for screening for other viral diseases: Secondary | ICD-10-CM

## 2017-08-02 DIAGNOSIS — Z114 Encounter for screening for human immunodeficiency virus [HIV]: Secondary | ICD-10-CM

## 2017-08-02 DIAGNOSIS — E119 Type 2 diabetes mellitus without complications: Secondary | ICD-10-CM

## 2017-08-02 LAB — HEPATIC FUNCTION PANEL
ALK PHOS: 76 U/L (ref 39–117)
ALT: 14 U/L (ref 0–35)
AST: 18 U/L (ref 0–37)
Albumin: 3.9 g/dL (ref 3.5–5.2)
BILIRUBIN DIRECT: 0.1 mg/dL (ref 0.0–0.3)
BILIRUBIN TOTAL: 0.5 mg/dL (ref 0.2–1.2)
Total Protein: 7.4 g/dL (ref 6.0–8.3)

## 2017-08-02 LAB — BASIC METABOLIC PANEL
BUN: 18 mg/dL (ref 6–23)
CHLORIDE: 104 meq/L (ref 96–112)
CO2: 29 meq/L (ref 19–32)
Calcium: 9.2 mg/dL (ref 8.4–10.5)
Creatinine, Ser: 0.95 mg/dL (ref 0.40–1.20)
GFR: 64.23 mL/min (ref >60.00)
GLUCOSE: 121 mg/dL — AB (ref 70–99)
Potassium: 3.9 mEq/L (ref 3.5–5.1)
Sodium: 140 mEq/L (ref 135–145)

## 2017-08-02 LAB — LIPID PANEL
CHOL/HDL RATIO: 3
Cholesterol: 96 mg/dL (ref 0–200)
HDL: 38.3 mg/dL — ABNORMAL LOW (ref >39.00)
LDL CALC: 37 mg/dL (ref 0–99)
NONHDL: 57.66
Triglycerides: 101 mg/dL (ref 0.0–149.0)
VLDL: 20.2 mg/dL (ref 0.0–40.0)

## 2017-08-02 LAB — HEMOGLOBIN A1C: Hgb A1c MFr Bld: 6.3 % (ref 4.6–6.5)

## 2017-08-02 NOTE — Telephone Encounter (Signed)
PCCs to note pt concern about the thyroid US scheduling

## 2017-08-03 ENCOUNTER — Ambulatory Visit
Admission: RE | Admit: 2017-08-03 | Discharge: 2017-08-03 | Disposition: A | Discharge status: Home / Self Care | Payer: BLUE CROSS/BLUE SHIELD | Source: Ambulatory Visit | Attending: Internal Medicine | Admitting: Internal Medicine

## 2017-08-03 DIAGNOSIS — E042 Nontoxic multinodular goiter: Secondary | ICD-10-CM

## 2017-08-03 LAB — HEPATITIS C ANTIBODY
Hepatitis C Ab: NONREACTIVE
SIGNAL TO CUT-OFF: 0.02 (ref <1.00)

## 2017-08-03 LAB — HIV ANTIBODY (ROUTINE TESTING W REFLEX): HIV: NONREACTIVE

## 2017-08-04 ENCOUNTER — Encounter: Payer: Self-pay | Admitting: Internal Medicine

## 2017-08-04 ENCOUNTER — Ambulatory Visit: Payer: BLUE CROSS/BLUE SHIELD | Admitting: Internal Medicine

## 2017-08-04 VITALS — BP 116/74 | HR 90 | Temp 97.9°F | Ht 64.0 in | Wt 237.0 lb

## 2017-08-04 DIAGNOSIS — E041 Nontoxic single thyroid nodule: Secondary | ICD-10-CM

## 2017-08-04 DIAGNOSIS — Z0000 Encounter for general adult medical examination without abnormal findings: Secondary | ICD-10-CM

## 2017-08-04 DIAGNOSIS — E119 Type 2 diabetes mellitus without complications: Secondary | ICD-10-CM | POA: Diagnosis not present

## 2017-08-04 DIAGNOSIS — I10 Essential (primary) hypertension: Secondary | ICD-10-CM

## 2017-08-04 NOTE — Patient Instructions (Signed)
Please continue all other medications as before, and refills have been done if requested.  Please have the pharmacy call with any other refills you may need.  Please continue your efforts at being more active, low cholesterol diet, and weight control.  You are otherwise up to date with prevention measures today.  Please keep your appointments with your specialists as you may have planned  Please return in 1 year for your yearly visit, or sooner if needed, with Lab testing done 3-5 days before  

## 2017-08-04 NOTE — Progress Notes (Signed)
Subjective:    Patient ID: Monica Michael, female    DOB: [birth year], 57 y.o.   MRN: [id number]  HPI  Here for wellness and f/u;  Overall doing ok;  Pt denies Chest pain, worsening SOB, DOE, wheezing, orthopnea, PND, worsening LE edema, palpitations, dizziness or syncope.  Pt denies neurological change such as new headache, facial or extremity weakness.  Pt denies polydipsia, polyuria, or low sugar symptoms. Pt states overall good compliance with treatment and medications, good tolerability, and has been trying to follow appropriate diet.  Pt denies worsening depressive symptoms, suicidal ideation or panic. No fever, night sweats, wt loss, loss of appetite, or other constitutional symptoms.  Pt states good ability with ADL's, has low fall risk, home safety reviewed and adequate, no other significant changes in hearing or vision, and only occasionally active with exercise.  Pt plans to foolow up with GYN for pap and mammogram  No other interval hx or new complaints Past Medical History:  Diagnosis Date  . Bronchitis    February, 2013  . CAD in native artery Jan '13   NSTEMI 07/2011: LHC 07/03/11: Proximal LAD 50-70%, circumflex 95% mid, ostial RCA 50% EF 60%.  PCI: Resolute DES to the mid circumflex  . Carotid artery disease ([place])    Doppler, December, 2013, 4-16% R. ICA, 60-63% LICA  //  Carotid US 8/17: Bilateral ICA 1-39% - follow-up when necessary // Carotid US 0/16: LICA 0-10; multinodular goiter   . Ejection fraction    EF 60%, catheterization, February, 2013  . HTN (hypertension)   . Hyperlipidemia   . Myocardial infarction ([place])    07/2011  . Other disorders of bone and cartilage(733.99)   . Paget's disease of bone    Noted on chest CT scan  . Tobacco abuse   . Type II or unspecified type diabetes mellitus without mention of complication, uncontrolled   . UTI (urinary tract infection) 03/25/13   Past Surgical History:  Procedure Laterality Date  . ABDOMINAL HYSTERECTOMY  2001  .  CORONARY ANGIOPLASTY WITH STENT PLACEMENT  07/2011   1 stent  . LEFT HEART CATHETERIZATION WITH CORONARY ANGIOGRAM N/A 07/03/2011   Procedure: LEFT HEART CATHETERIZATION WITH CORONARY ANGIOGRAM;  Surgeon: [name], MD;  Location: [facility] CATH LAB;  Service: Cardiovascular;  Laterality: N/A;  . PERCUTANEOUS CORONARY STENT INTERVENTION (PCI-S) Right 07/03/2011   Procedure: PERCUTANEOUS CORONARY STENT INTERVENTION (PCI-S);  Surgeon: [name], MD;  Location: [facility] CATH LAB;  Service: Cardiovascular;  Laterality: Right;    reports that she has quit smoking. Her smoking use included cigarettes. She has a 10.00 pack-year smoking history. she has never used smokeless tobacco. She reports that she drinks about 1.2 oz of alcohol per week. She reports that she does not use drugs. family history includes Coronary artery disease in her father; Diabetes in her father; Heart failure (age of onset: 3) in her father; Hodgkin's lymphoma (age of onset: 27) in her mother; Hyperlipidemia in her father; Hypertension in her father; Kidney failure in her father. Allergies  Allergen Reactions  . Januvia [Sitagliptin] Other (See Comments)    Renal worsening  . Metformin And Related Other (See Comments)    GI upset   Current Outpatient Medications on File Prior to Visit  Medication Sig Dispense Refill  . aspirin 81 MG tablet Take 81 mg by mouth daily.    . canagliflozin (INVOKANA) 300 MG TABS tablet Take 1 tablet (300 mg total) by mouth daily before  breakfast. 90 tablet 3  . glipiZIDE (GLUCOTROL XL) 10 MG 24 hr tablet TAKE 1 TABLET(10 MG) BY MOUTH DAILY WITH BREAKFAST 90 tablet 2  . losartan (COZAAR) 25 MG tablet TAKE 1 TABLET(25 MG) BY MOUTH DAILY 90 tablet 2  . nitroGLYCERIN (NITROSTAT) 0.4 MG SL tablet Place 1 tablet (0.4 mg total) under the tongue every 5 (five) minutes as needed for chest pain. 25 tablet 3  . pioglitazone (ACTOS) 30 MG tablet Take 1 tablet (30 mg total) by mouth daily. 90 tablet 3  .  rosuvastatin (CRESTOR) 40 MG tablet TAKE 1 TABLET(40 MG) BY MOUTH DAILY 90 tablet 2  . TRULICITY 6.33 HL/4.5GY SOPN INJECT 0.5 MLS INTO THE SKIN ONCE A WEEK 2 mL 2  . [DISCONTINUED] lisinopril (PRINIVIL,ZESTRIL) 10 MG tablet Take 0.5 tablets (5 mg total) by mouth daily. 30 tablet 6   No current facility-administered medications on file prior to visit.    Review of Systems Constitutional: Negative for other unusual diaphoresis, sweats, appetite or weight changes HENT: Negative for other worsening hearing loss, ear pain, facial swelling, mouth sores or neck stiffness.   Eyes: Negative for other worsening pain, redness or other visual disturbance.  Respiratory: Negative for other stridor or swelling Cardiovascular: Negative for other palpitations or other chest pain  Gastrointestinal: Negative for worsening diarrhea or loose stools, blood in stool, distention or other pain Genitourinary: Negative for hematuria, flank pain or other change in urine volume.  Musculoskeletal: Negative for myalgias or other joint swelling.  Skin: Negative for other color change, or other wound or worsening drainage.  Neurological: Negative for other syncope or numbness. Hematological: Negative for other adenopathy or swelling Psychiatric/Behavioral: Negative for hallucinations, other worsening agitation, SI, self-injury, or new decreased concentration No other exam finding    Objective:   Physical Exam BP 116/74   Pulse 90   Temp 97.9 F (36.6 C) (Oral)   Ht 5\' 4"  (1.626 m)   Wt 237 lb (107.5 kg)   SpO2 97%   BMI 40.68 kg/m  VS noted,  Constitutional: Pt is oriented to person, place, and time. Appears well-developed and well-nourished, in no significant distress and comfortable Head: Normocephalic and atraumatic  Eyes: Conjunctivae and EOM are normal. Pupils are equal, round, and reactive to light Right Ear: External ear normal without discharge Left Ear: External ear normal without discharge Nose: Nose  without discharge or deformity Mouth/Throat: Oropharynx is without other ulcerations and moist  Neck: Normal range of motion. Neck supple. No JVD present. No tracheal deviation present or significant neck LA or mass Cardiovascular: Normal rate, regular rhythm, normal heart sounds and intact distal pulses.   Pulmonary/Chest: WOB normal and breath sounds without rales or wheezing  Abdominal: Soft. Bowel sounds are normal. NT. No HSM  Musculoskeletal: Normal range of motion. Exhibits no edema Lymphadenopathy: Has no other cervical adenopathy.  Neurological: Pt is alert and oriented to person, place, and time. Pt has normal reflexes. No cranial nerve deficit. Motor grossly intact, Gait intact Skin: Skin is warm and dry. No rash noted or new ulcerations Psychiatric:  Has nervous mood and affect. Behavior is normal without agitation No other exam findings       Assessment & Plan:

## 2017-08-07 ENCOUNTER — Encounter: Payer: Self-pay | Admitting: Internal Medicine

## 2017-08-07 NOTE — Assessment & Plan Note (Signed)
Lab Results  Component Value Date   HGBA1C 6.3 08/02/2017  stable overall by history and exam, recent data reviewed with pt, and pt to continue medical treatment as before,  to f/u any worsening symptoms or concerns

## 2017-08-07 NOTE — Assessment & Plan Note (Signed)

## 2017-08-07 NOTE — Assessment & Plan Note (Signed)
stable overall by history and exam, recent data reviewed with pt, and pt to continue medical treatment as before,  to f/u any worsening symptoms or concerns BP Readings from Last 3 Encounters:  08/04/17 116/74  07/05/17 118/60  02/18/17 116/72

## 2017-08-07 NOTE — Assessment & Plan Note (Signed)
Recent finding, follow up u/s and for FNA if indicated

## 2017-08-23 ENCOUNTER — Other Ambulatory Visit: Payer: Self-pay | Admitting: Internal Medicine

## 2017-10-13 ENCOUNTER — Other Ambulatory Visit: Payer: Self-pay | Admitting: Internal Medicine

## 2017-11-19 ENCOUNTER — Other Ambulatory Visit: Payer: Self-pay | Admitting: Internal Medicine

## 2018-02-17 ENCOUNTER — Encounter: Payer: Self-pay | Admitting: Internal Medicine

## 2018-02-17 DIAGNOSIS — K921 Melena: Secondary | ICD-10-CM

## 2018-02-17 DIAGNOSIS — K6289 Other specified diseases of anus and rectum: Secondary | ICD-10-CM

## 2018-02-20 NOTE — Telephone Encounter (Signed)
Ok for GI referral, but needs to go to ED for persistent or worsening pain and bleeding, as referral to GI can take over 1 month

## 2018-02-21 ENCOUNTER — Encounter: Payer: Self-pay | Admitting: Gastroenterology

## 2018-03-05 ENCOUNTER — Other Ambulatory Visit: Payer: Self-pay | Admitting: Internal Medicine

## 2018-03-14 ENCOUNTER — Encounter

## 2018-03-14 ENCOUNTER — Ambulatory Visit: Payer: BLUE CROSS/BLUE SHIELD | Admitting: Gastroenterology

## 2018-03-14 ENCOUNTER — Encounter: Payer: Self-pay | Admitting: Gastroenterology

## 2018-03-14 VITALS — BP 140/90 | HR 68 | Ht 64.0 in | Wt 253.0 lb

## 2018-03-14 DIAGNOSIS — K602 Anal fissure, unspecified: Secondary | ICD-10-CM | POA: Diagnosis not present

## 2018-03-14 DIAGNOSIS — K6289 Other specified diseases of anus and rectum: Secondary | ICD-10-CM

## 2018-03-14 DIAGNOSIS — K648 Other hemorrhoids: Secondary | ICD-10-CM

## 2018-03-14 DIAGNOSIS — Z86010 Personal history of colonic polyps: Secondary | ICD-10-CM | POA: Diagnosis not present

## 2018-03-14 DIAGNOSIS — K5730 Diverticulosis of large intestine without perforation or abscess without bleeding: Secondary | ICD-10-CM

## 2018-03-14 MED ORDER — AMBULATORY NON FORMULARY MEDICATION: 1 refills | Status: AC

## 2018-03-14 NOTE — Patient Instructions (Addendum)
We have sent a prescription for nitroglycerin 0.125% gel to [facility]. You should apply a pea size amount to your rectum three times daily x 6-8 weeks.  [facility]'s information is below: Address: [address] [place], [place] [address]  [phone]  *Please DO NOT go directly from our office to pick up this medication! Give the pharmacy 1 day to process the prescription as this is compounded at takes time to make.  We will call you to schedule your colonoscopy.

## 2018-03-14 NOTE — Progress Notes (Signed)
Referring Provider: [name], MD Primary Care Physician:  [name], MD   Reason for Consultation:  Painful bowel movements   IMPRESSION:  Anal fissure presenting with rectal pain with some bright red blood per rectum History of colon polyps    - tubular adenoma and hyperplastic polyp removed 2014    - surveillance colonoscopy recommended 2019 Internal hemorrhoids on 2014 colonoscopy Diverticulosis  Recent symptoms are likely due to the anal fissure. However, I did not perform a full digital rectal exam today due to patient discomfort. We both agreed that if her symptoms are not improving, further investigation/evaluation will be critically important.  PLAN: Nitrogycerin ointment 0.125% applied as a pea size amount TID to the rectum for 6-8 weeks Continue daily stool softener, stool bulking agent such at [facility] recommended Anal care brochure provided to the patient Call if no improvement after 4 weeks Colonoscopy in 6-8 weeks   I consented the patient at the bedside today discussing the risks, benefits, and alternatives to endoscopic evaluation. In particular, we discussed the risks that include, but are not limited to, reaction to medication, cardiopulmonary compromise, bleeding requiring blood transfusion, aspiration resulting in pneumonia, perforation requiring surgery, and even death. We reviewed the risk of missed lesion including polyps or even cancer. The patient acknowledges these risks and asks that we proceed.   HPI: Monica Michael is a 57 y.o. female seen in consultation at the request of Dr. Jenny Reichmann for further evaluation of painful bowel movements. Retired Scientist, forensic.  History is obtained through the patient and review of her electronic health record.   She reports an 8-week history of painful bowel movements.  Developed after eating out and having chicken salad for lunch. Had 7 BM that day. Some irritable pain after that time. Pain begins prior to  defecation and stops with a complete evacuation of stool.  She has seen some bright red blood. She has known hemorrhoids but they have never caused pain before. There is a sense of incomplete evacuation that is new.  She is tried docusate sodium, Preparation H, sitz baths x 1 week (initially helped), and increasing the fiber in her diet without any change in her symptoms. Even hurts to pass gas. Having one BM daily. No tear or history of constipation. Symptoms to be getting progressively worse. Symptoms are always bad. Some days feel like "Steak knife" pain and others feel like "butter knife" pain. No other associated symptoms. No identified exacerbating or relieving features.   A screening colonoscopy performed 03/27/2013 by Dr. Erskine Emery showed moderate diverticulosis in the sigmoid descending and transverse colons, and hemorrhoids, and 2 small polyps removed from the sigmoid and descending colons.  Pathology showed one tubular adenoma and hyperplastic polyps.  Dr. Deatra Ina recommended a surveillance colonoscopy in 5 years.    Past Medical History:  Diagnosis Date  . Bronchitis    February, 2013  . CAD in native artery Jan '13   NSTEMI 07/2011: LHC 07/03/11: Proximal LAD 50-70%, circumflex 95% mid, ostial RCA 50% EF 60%.  PCI: Resolute DES to the mid circumflex  . Carotid artery disease ([place])    Doppler, December, 2013, 2-69% R. ICA, 48-54% LICA  //  Carotid US 8/17: Bilateral ICA 1-39% - follow-up when necessary // Carotid US 6/27: LICA 0-35; multinodular goiter   . Ejection fraction    EF 60%, catheterization, February, 2013  . HTN (hypertension)   . Hyperlipidemia   . Myocardial infarction ([place])    07/2011  .  Other disorders of bone and cartilage(733.99)   . Paget's disease of bone    Noted on chest CT scan  . Tobacco abuse   . Type II or unspecified type diabetes mellitus without mention of complication, uncontrolled   . UTI (urinary tract infection) 03/25/13    Past Surgical History:   Procedure Laterality Date  . ABDOMINAL HYSTERECTOMY  2001  . CORONARY ANGIOPLASTY WITH STENT PLACEMENT  07/2011   1 stent  . LEFT HEART CATHETERIZATION WITH CORONARY ANGIOGRAM N/A 07/03/2011   Procedure: LEFT HEART CATHETERIZATION WITH CORONARY ANGIOGRAM;  Surgeon: [name], MD;  Location: [facility] CATH LAB;  Service: Cardiovascular;  Laterality: N/A;  . PERCUTANEOUS CORONARY STENT INTERVENTION (PCI-S) Right 07/03/2011   Procedure: PERCUTANEOUS CORONARY STENT INTERVENTION (PCI-S);  Surgeon: [name], MD;  Location: [facility] CATH LAB;  Service: Cardiovascular;  Laterality: Right;    Prior to Admission medications   Medication Sig Start Date End Date Taking? Authorizing Provider  aspirin 81 MG tablet Take 81 mg by mouth daily.    [provider]  glipiZIDE (GLUCOTROL XL) 10 MG 24 hr tablet TAKE 1 TABLET(10 MG) BY MOUTH DAILY WITH BREAKFAST 07/20/17   [name], MD  INVOKANA 300 MG TABS tablet TAKE 1 TABLET(300 MG) BY MOUTH DAILY BEFORE BREAKFAST 11/21/17   [name], MD  losartan (COZAAR) 25 MG tablet TAKE 1 TABLET(25 MG) BY MOUTH DAILY 07/20/17   [name], MD  nitroGLYCERIN (NITROSTAT) 0.4 MG SL tablet Place 1 tablet (0.4 mg total) under the tongue every 5 (five) minutes as needed for chest pain. 01/26/16   Richardson Dopp T, PA-C  pioglitazone (ACTOS) 30 MG tablet TAKE 1 TABLET(30 MG) BY MOUTH DAILY 03/06/18   [name], MD  rosuvastatin (CRESTOR) 40 MG tablet TAKE 1 TABLET(40 MG) BY MOUTH DAILY 07/20/17   [name], MD  TRULICITY 0.34 VQ/2.5ZD SOPN INJECT 0.75 MG UNDER THE SKIN EVERY WEEK 10/13/17   [name], MD  lisinopril (PRINIVIL,ZESTRIL) 10 MG tablet Take 0.5 tablets (5 mg total) by mouth daily. 07/23/11 08/09/11  Richardson Dopp T, PA-C    Current Outpatient Medications  Medication Sig Dispense Refill  . aspirin 81 MG tablet Take 81 mg by mouth daily.    Marland Kitchen glipiZIDE (GLUCOTROL XL) 10 MG 24 hr tablet TAKE 1 TABLET(10 MG) BY MOUTH DAILY WITH BREAKFAST 90  tablet 2  . INVOKANA 300 MG TABS tablet TAKE 1 TABLET(300 MG) BY MOUTH DAILY BEFORE BREAKFAST 90 tablet 2  . losartan (COZAAR) 25 MG tablet TAKE 1 TABLET(25 MG) BY MOUTH DAILY 90 tablet 2  . nitroGLYCERIN (NITROSTAT) 0.4 MG SL tablet Place 1 tablet (0.4 mg total) under the tongue every 5 (five) minutes as needed for chest pain. 25 tablet 3  . pioglitazone (ACTOS) 30 MG tablet TAKE 1 TABLET(30 MG) BY MOUTH DAILY 90 tablet 1  . rosuvastatin (CRESTOR) 40 MG tablet TAKE 1 TABLET(40 MG) BY MOUTH DAILY 90 tablet 2  . TRULICITY 6.38 VF/6.4PP SOPN INJECT 0.75 MG UNDER THE SKIN EVERY WEEK 2 mL 5   No current facility-administered medications for this visit.     Allergies as of 03/14/2018 - Review Complete 08/07/2017  Allergen Reaction Noted  . Januvia [sitagliptin] Other (See Comments) 02/18/2016  . Metformin and related Other (See Comments) 02/18/2016    Family History  Problem Relation Age of Onset  . Heart failure Father 52       deceased  . Diabetes Father   .  Kidney failure Father   . Hypertension Father   . Hyperlipidemia Father   . Coronary artery disease Father   . Hodgkin's lymphoma Mother 38       deceased  . Cancer Neg Hx        breast, colon  . Colon cancer Neg Hx   . Rectal cancer Neg Hx   . Stomach cancer Neg Hx   . Esophageal cancer Neg Hx     Social History   Socioeconomic History  . Marital status: Widowed    Spouse name: Not on file  . Number of children: 1  . Years of education: 58  . Highest education level: Not on file  Occupational History    Employer: [employer]  . Financial resource strain: Not on file  . Food insecurity:    Worry: Not on file    Inability: Not on file  . Transportation needs:    Medical: Not on file    Non-medical: Not on file  Tobacco Use  . Smoking status: Former Smoker    Packs/day: 0.50    Years: 20.00    Pack years: 10.00    Types: Cigarettes  . Smokeless tobacco: Never Used  . Tobacco comment:  discussed cessation strategy, Quit date set for 10/5  Substance and Sexual Activity  . Alcohol use: Yes    Alcohol/week: 2.0 standard drinks    Types: 2 Cans of beer per week  . Drug use: No  . Sexual activity: Not Currently  Lifestyle  . Physical activity:    Days per week: Not on file    Minutes per session: Not on file  . Stress: Not on file  Relationships  . Social connections:    Talks on phone: Not on file    Gets together: Not on file    Attends religious service: Not on file    Active member of club or organization: Not on file    Attends meetings of clubs or organizations: Not on file    Relationship status: Not on file  . Intimate partner violence:    Fear of current or ex partner: Not on file    Emotionally abused: Not on file    Physically abused: Not on file    Forced sexual activity: Not on file  Other Topics Concern  . Not on file  Social History Narrative   [employer] Married '84-14 yrs; divorced; married '06 - 5 yers/widowed. 1 son - '89. Work-quality reviewer in an office setting.    Review of Systems: 12 system ROS is negative except as noted above.   Physical Exam: @VSRANGES @   General:   Alert,  Well-developed, well-nourished, pleasant and cooperative in NAD. Anxious. Head:  Normocephalic and atraumatic. Eyes:  Sclera clear, no icterus.   Conjunctiva pink. Mouth:  No deformity or lesions.   Neck:  Supple; no masses or thyromegaly. Lungs:  Clear throughout to auscultation.   No wheezes, crackles, or rhonchi.  Heart:  Regular rate and rhythm; no murmurs, clicks, rubs,  or gallops. Abdomen:  Central obesity, soft, NT, non distended.  Rectal: There is an external hemorrhoid in the 9'o'clock position. There is a small fissure present in the 3 o'clock position. No chemical dermatitis. No fistula. No prolapsing hemorrhoids. No rectal prolapse. Brown stool present in the anal canal. No mass but a digital recta exam was not performed. Msk:  Symmetrical without  gross deformities. . Pulses:  Normal pulses noted. Extremities:  Without clubbing  or edema. Neurologic:  Alert and  oriented x4;  grossly normal neurologically. Skin:  Intact without significant lesions or rashes.. Psych:  Alert and cooperative. Normal mood and affect.  Thornton Park, MD, MPH [facility]

## 2018-03-26 ENCOUNTER — Other Ambulatory Visit: Payer: Self-pay | Admitting: Internal Medicine

## 2018-04-08 ENCOUNTER — Other Ambulatory Visit: Payer: Self-pay | Admitting: Internal Medicine

## 2018-04-23 ENCOUNTER — Other Ambulatory Visit: Payer: Self-pay | Admitting: Internal Medicine

## 2018-07-10 ENCOUNTER — Other Ambulatory Visit: Payer: Self-pay | Admitting: Internal Medicine

## 2018-08-21 ENCOUNTER — Other Ambulatory Visit: Payer: Self-pay | Admitting: Internal Medicine

## 2018-11-01 ENCOUNTER — Other Ambulatory Visit: Payer: Self-pay | Admitting: *Deleted

## 2018-11-01 MED ORDER — CANAGLIFLOZIN 300 MG PO TABS: 300.0000 mg | ORAL_TABLET | Freq: Every day | ORAL | 0 refills | Status: AC

## 2019-08-22 ENCOUNTER — Encounter: Payer: Self-pay | Admitting: General Practice

## 2020-04-09 ENCOUNTER — Encounter: Payer: Self-pay | Admitting: Internal Medicine
